# Patient Record
Sex: Female | Born: 1940
Health system: Southern US, Community
[De-identification: ages and names within clinical notes are randomized; demographics above are authoritative.]

## PROBLEM LIST (undated history)

## (undated) DIAGNOSIS — H539 Unspecified visual disturbance: Secondary | ICD-10-CM

## (undated) DIAGNOSIS — E0590 Thyrotoxicosis, unspecified without thyrotoxic crisis or storm: Secondary | ICD-10-CM

## (undated) DIAGNOSIS — G9050 Complex regional pain syndrome I, unspecified: Secondary | ICD-10-CM

## (undated) HISTORY — DX: Thyrotoxicosis, unspecified without thyrotoxic crisis or storm: E05.90

## (undated) HISTORY — PX: TONSILLECTOMY: SUR1361

## (undated) HISTORY — DX: Unspecified visual disturbance: H53.9

## (undated) HISTORY — DX: Complex regional pain syndrome I, unspecified: G90.50

---

## 1998-12-15 ENCOUNTER — Other Ambulatory Visit: Admission: RE | Admit: 1998-12-15 | Discharge: 1998-12-15 | Payer: Self-pay | Admitting: Gynecology

## 2000-01-23 ENCOUNTER — Ambulatory Visit (HOSPITAL_COMMUNITY): Admission: RE | Admit: 2000-01-23 | Discharge: 2000-01-23 | Payer: Self-pay | Admitting: Gynecology

## 2000-01-23 ENCOUNTER — Encounter: Payer: Self-pay | Admitting: Gynecology

## 2003-08-05 ENCOUNTER — Encounter: Payer: Self-pay | Admitting: Family Medicine

## 2003-08-05 ENCOUNTER — Encounter: Admission: RE | Admit: 2003-08-05 | Discharge: 2003-08-05 | Payer: Self-pay | Admitting: Family Medicine

## 2003-09-30 ENCOUNTER — Encounter: Payer: Self-pay | Admitting: Orthopedic Surgery

## 2003-09-30 ENCOUNTER — Encounter: Admission: RE | Admit: 2003-09-30 | Discharge: 2003-09-30 | Payer: Self-pay | Admitting: Orthopedic Surgery

## 2005-08-21 ENCOUNTER — Other Ambulatory Visit: Admission: RE | Admit: 2005-08-21 | Discharge: 2005-08-21 | Payer: Self-pay | Admitting: Gynecology

## 2005-10-23 ENCOUNTER — Encounter: Admission: RE | Admit: 2005-10-23 | Discharge: 2005-10-23 | Payer: Self-pay | Admitting: Family Medicine

## 2006-01-08 ENCOUNTER — Encounter: Admission: RE | Admit: 2006-01-08 | Discharge: 2006-01-08 | Payer: Self-pay | Admitting: Rheumatology

## 2006-03-28 ENCOUNTER — Encounter: Admission: RE | Admit: 2006-03-28 | Discharge: 2006-03-28 | Payer: Self-pay | Admitting: Neurology

## 2007-02-25 ENCOUNTER — Encounter: Admission: RE | Admit: 2007-02-25 | Discharge: 2007-03-18 | Payer: Self-pay | Admitting: Family Medicine

## 2007-03-19 ENCOUNTER — Encounter: Admission: RE | Admit: 2007-03-19 | Discharge: 2007-04-05 | Payer: Self-pay | Admitting: Family Medicine

## 2007-03-21 ENCOUNTER — Other Ambulatory Visit: Admission: RE | Admit: 2007-03-21 | Discharge: 2007-03-21 | Payer: Self-pay | Admitting: Gynecology

## 2008-08-25 ENCOUNTER — Encounter: Admission: RE | Admit: 2008-08-25 | Discharge: 2008-11-23 | Payer: Self-pay | Admitting: Rheumatology

## 2011-01-14 ENCOUNTER — Encounter: Payer: Self-pay | Admitting: Otolaryngology

## 2013-01-15 ENCOUNTER — Other Ambulatory Visit: Payer: Self-pay | Admitting: Otolaryngology

## 2013-01-15 DIAGNOSIS — H9120 Sudden idiopathic hearing loss, unspecified ear: Secondary | ICD-10-CM

## 2013-01-15 DIAGNOSIS — H905 Unspecified sensorineural hearing loss: Secondary | ICD-10-CM

## 2013-05-14 ENCOUNTER — Other Ambulatory Visit: Payer: Self-pay | Admitting: Obstetrics and Gynecology

## 2013-05-14 ENCOUNTER — Other Ambulatory Visit (HOSPITAL_COMMUNITY)
Admission: RE | Admit: 2013-05-14 | Discharge: 2013-05-14 | Disposition: A | Discharge status: Home / Self Care | Payer: PRIVATE HEALTH INSURANCE | Source: Ambulatory Visit | Attending: Obstetrics and Gynecology | Admitting: Obstetrics and Gynecology

## 2013-05-14 DIAGNOSIS — Z01419 Encounter for gynecological examination (general) (routine) without abnormal findings: Secondary | ICD-10-CM | POA: Insufficient documentation

## 2013-05-14 DIAGNOSIS — Z1151 Encounter for screening for human papillomavirus (HPV): Secondary | ICD-10-CM | POA: Insufficient documentation

## 2014-05-27 ENCOUNTER — Encounter: Payer: Self-pay | Admitting: Internal Medicine

## 2014-07-09 ENCOUNTER — Ambulatory Visit: Payer: Self-pay | Admitting: Internal Medicine

## 2015-01-19 ENCOUNTER — Other Ambulatory Visit: Payer: Self-pay

## 2015-01-21 ENCOUNTER — Other Ambulatory Visit: Payer: Self-pay | Admitting: Obstetrics and Gynecology

## 2015-01-21 DIAGNOSIS — Z139 Encounter for screening, unspecified: Secondary | ICD-10-CM

## 2015-01-28 ENCOUNTER — Other Ambulatory Visit: Payer: Self-pay

## 2015-01-29 ENCOUNTER — Other Ambulatory Visit: Payer: Self-pay

## 2015-04-05 ENCOUNTER — Ambulatory Visit
Admission: RE | Admit: 2015-04-05 | Discharge: 2015-04-05 | Disposition: A | Discharge status: Home / Self Care | Payer: Medicare Other | Source: Ambulatory Visit | Attending: Obstetrics and Gynecology | Admitting: Obstetrics and Gynecology

## 2015-04-05 DIAGNOSIS — Z139 Encounter for screening, unspecified: Secondary | ICD-10-CM

## 2015-05-27 ENCOUNTER — Other Ambulatory Visit (HOSPITAL_COMMUNITY)
Admission: RE | Admit: 2015-05-27 | Discharge: 2015-05-27 | Disposition: A | Discharge status: Home / Self Care | Payer: Medicare Other | Source: Ambulatory Visit | Attending: Obstetrics and Gynecology | Admitting: Obstetrics and Gynecology

## 2015-05-27 ENCOUNTER — Other Ambulatory Visit: Payer: Self-pay | Admitting: Obstetrics and Gynecology

## 2015-05-27 DIAGNOSIS — Z124 Encounter for screening for malignant neoplasm of cervix: Secondary | ICD-10-CM | POA: Diagnosis present

## 2015-05-31 LAB — CYTOLOGY - PAP

## 2017-01-29 ENCOUNTER — Encounter: Payer: Self-pay | Admitting: Psychology

## 2017-01-29 ENCOUNTER — Ambulatory Visit (INDEPENDENT_AMBULATORY_CARE_PROVIDER_SITE_OTHER): Payer: Medicare Other | Admitting: Psychology

## 2017-01-29 DIAGNOSIS — R413 Other amnesia: Secondary | ICD-10-CM | POA: Diagnosis not present

## 2017-01-29 NOTE — Progress Notes (Signed)
NEUROPSYCHOLOGICAL INTERVIEW (CPT: [id number])  Name: Tami Lewis Date of Birth: 15-Jul-1941 Date of Interview: 01/29/2017  Reason for Referral:  Tami Lewis is a 76 y.o. female who is referred for neuropsychological evaluation by Dr. Merlene Laughter of [facility] due to concerns about memory loss. This patient is accompanied in the office by her daughter who supplements the history.  History of Presenting Problem:  Tami Lewis endorsed some cognitive changes in recent years but she feels this is related to significant physical pain. She explains that she had an injury 28 years ago and developed RSD as a result. She did fall and hit her head in the context of that injury but did not lose consciousness. She reported being dazed briefly. She notes that she has had chronic pain since the injury. She has tried many different medications but reported negative side effects. She is currently treated by a naturopathic provider with "supportive nutrition to offset the pain". She takes a variety of supplements and vitamins. She also uses "bio magnets" to help with the pain and wears a wrap on her head daily for 30-60 minutes. The patient reported that the most helpful medication for her pain is Xanax which she has taken for many years. She currently takes half a milligram at least 4 times a day for a total of at least 2 mg per day. She and her daughter noted that in November 2017 her dosage was increased without her realizing it so she was taking at least twice as much. She appeared "drugged" per her daughter. Once they became aware of the mixup in the amount she was taking, she reduced back to her normal dosage without any problems.  The patient and her daughter reported that cognitive symptoms are episodic. There are times when she is "so on the ball" and then other times when she is cognitively impaired, per her daughter. They believe her cognitive symptoms are likely correlated  to pain levels. The patient's sister is having cognitive issues as well and is currently going through testing for these. Family history is also significant for Alzheimer's disease in the patient's paternal aunt.  Upon direct questioning, the patient and her daughter reported episodic forgetfulness for recent conversations/events, misplacing/losing items, forgetting appointments. She endorsed very mild word finding difficulty. She denied significant difficulty with navigation but noted that her husband drives most of the time. She has never gotten lost.  The patient also reported a history of sleep difficulties secondary to pain since her injury in 3.  Tami Lewis has not worked since her injury in 55. Workers compensation ultimately rejected her claim stating that her symptoms were "subjective", per the patient.   Current Functioning: Tami Lewis continues to manage instrumental ADLs when she is physically able. As noted previously, she does very little driving but denies any difficulty with this. She manages her medications independently and denied forgetting or missing doses. She uses a pillbox. She manages the family finances and denies any problems with this. Her daughter assists with management of appointments as the patient has a tendency to forget or get confused about appointment dates and times.  Physically, the patient reported that she currently has episodic burning pain throughout her body. She reported that she has "horrendous pelvic pain off and on".  The patient reported that her mood "varies" depending on her pain level and the weather. She does not consider herself a depressed or moody person. She denies any chronic or ongoing anxiety.  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 She has been disabled since [id number]. Marital history: Married since 1963. Three children. Seven grandchildren. Alcohol/Tobacco/Substances: Very little alcohol -- maybe twice a month she will have a small glass of wine. Never a smoker. No SA.   Medical History (per Dr. Laverle Lewis's note): Fibromyalgia Reflex Sympathetic Dystrophy Osteoporosis  Current Medications (per Dr. Laverle Lewis's note):  Alprazolam 1 mg Armour Thyroid 60 mg Vitamin D 400 unit capsule Probiotic Bone Smart Krill oil 1000 mg capsule Magnesium 300 mg Claritin 10 mg as needed Turmeric complex   Behavioral Observations:   Appearance: Neatly and appropriately dressed and groomed. Wearing a surgical mask as she is getting over a cold/flu. Gait: Ambulated independently, no gross abnormalities observed Speech: Fluent; normal rate, rhythm and volume. Talkative. Slightly atypical word choice at times / mildly paraphasic.  Thought process: Tangential Affect: Full, euthymic Interpersonal: Pleasant, appropriate   TESTING: There is medical necessity to proceed with neuropsychological assessment as the results will be used to aid in differential diagnosis and clinical decision-making and to inform specific treatment recommendations. Per the patient, her daughter and medical records reviewed, there has been a change in cognitive functioning and a reasonable suspicion of neurocognitive disorder.   PLAN: The patient will return for a full battery of neuropsychological testing with a psychometrician under my supervision. Education regarding testing procedures was provided. Subsequently, the patient will see this provider for a follow-up session at which time her test performances and my impressions and treatment recommendations will be reviewed in detail.   Full neuropsychological evaluation report to follow.

## 2017-01-30 ENCOUNTER — Telehealth: Payer: Self-pay | Admitting: Psychology

## 2017-01-30 NOTE — Progress Notes (Signed)
Returned Tami Lewis's call and spoke to her for 20 minutes. Tami Lewis had questions about the testing process and how we will be able to differentiate cause of cognitive symptoms (eg mood, pain, medication, dementia). I answered her questions to the best of my ability. She also provided her observations of the patient over the years and more recently. She reported observing a long history of variable mood and a tendency for her mother to lash out in conversation. Tami Lewis described that the family has to walk on eggshells. This is not new. Additionally, she reported that the patient has a long history of purchasing things that she does not need off of [employer] and other shopping networks. She ends up returning many of the items. This also is not new behavior and has gone on for many years. She reported that the patient gets very upset if she is questioned on anything she is doing. She reported the patient spends much of her time researching possible interventions for her pain, and if anyone questions any of these interventions, she may explode and say "You just want me to be in pain and in a wheelchair?" She reported the patient has hearing loss, and she has gone to the audiologist multiple times. She will say she wants hearing aids and then when they get there she doesn't think she needs them. She did ultimately get a pair and they seem to help when she wears them. In terms of newer behaviors/changes, Tami Lewis reported that the patient is "slowing down" and having more trouble keeping up with bookkeeping. She has paid bills twice. Tami Lewis started helping with the bills and put them on automatic-pay. The patient seemed accepting of this at the time but later complained to her husband that Tami Lewis "came in here and moved all my stuff".  Tami Lewis also notes that in addition to having the evaluation done, they want to know about available resources for family support and education.

## 2017-01-30 NOTE — Telephone Encounter (Signed)
-----   Message from Sheryn BisonBrandy L Reed sent at 01/29/2017  4:21 PM EST ----- Hi Dr. Gilberto BetterBailar  Hawa Pitkin's daughter April called [phone] and would like you to please call her.  She was seen on 01/29/17.  Thank you  Gearldine BienenstockBrandy

## 2017-02-21 ENCOUNTER — Ambulatory Visit (INDEPENDENT_AMBULATORY_CARE_PROVIDER_SITE_OTHER): Payer: Medicare Other | Admitting: Psychology

## 2017-02-21 DIAGNOSIS — R413 Other amnesia: Secondary | ICD-10-CM

## 2017-02-21 NOTE — Progress Notes (Signed)
   Neuropsychology Note  Tami SageBarbara J Lewis returned today for 3 hours of neuropsychological testing with technician, Tami Lewis, BS, under the supervision of Dr. Elvis CoilMaryBeth Bailar. The patient did not appear overtly distressed by the testing session, per behavioral observation or via self-report to the technician. Rest breaks were offered. Tami Lewis will return within 2 weeks for a feedback session with Tami Lewis at which time her test performances, clinical impressions and treatment recommendations will be reviewed in detail. The patient understands she can contact our office should she require our assistance before this time.  Full report to follow.

## 2017-03-09 ENCOUNTER — Telehealth: Payer: Self-pay | Admitting: Psychology

## 2017-03-09 NOTE — Telephone Encounter (Signed)
Dr. Alinda DoomsBailar,  April the patient's daughter would like to talk with you before the follow up appointment for her Mother.  April - [phone]

## 2017-03-13 NOTE — Telephone Encounter (Signed)
-----   Message from Sheryn BisonBrandy L Reed sent at 03/13/2017 10:23 AM EDT ----- Regarding: Not feeling well Contact: [phone] Hi Dr. Gilberto BetterBailar  [facility] and has had a full blown burning rash over her body (nerve pain). She was not sure if this is something she should see you for? She said it does get into your head after having it for so many years. She said she needs some relief? Should she see her PCP? She does have a follow up appointment coming up soon. Please Advise   Thank you Gearldine BienenstockBrandy

## 2017-03-13 NOTE — Progress Notes (Signed)
Left message for patient's daughter, returning her call from last week. Advised that I do not have results of patient's testing yet but will have them on or by 3/27 (prior to their 3/29 appointment with me). Advised that she can call me on 3/27 to discuss, but if she wanted to speak before that, she can also call anytime.

## 2017-03-13 NOTE — Progress Notes (Signed)
Called patient back. She described the rash she is experiencing, and notes that she thinks it may be a stress reaction to the neuropsych testing she did in our office recently, and getting over influenza B. She says this is common in RSD but concerning to her. She thinks the rash is subsiding but it has not completely resolved. She reported it is increasing her anxiety and she is having to take more alprazolam. I advised her to contact her PCP to advise her whether she should seek medical attention. She will be seeing me on 3/29 to follow up and review neurocognitive testing results.

## 2017-03-13 NOTE — Progress Notes (Signed)
Patient's daughter, April, returned my call and we spoke for 10 minutes. Patient is scheduled to see me on 3/29 for follow-up to review results of recent neuropsych evaluation. April wanted to know what this appointment will entail, and I described this to her. We also arranged to speak again on 3/27 via phone as I will have the results at that time; this way we can discuss prior to the patient's appointment in my office on 3/29.

## 2017-03-15 ENCOUNTER — Telehealth: Payer: Self-pay | Admitting: Psychology

## 2017-03-15 NOTE — Progress Notes (Signed)
Called patient back to let her know that our neurologists do not see CRPS/RSD so she will need to check with other neurology practices if she wants to see this specialty.

## 2017-03-15 NOTE — Telephone Encounter (Signed)
Tami OddiBarbara Ludington 12/18/1941. She would like to speak with you regarding seeing one of our neurologist here for her CRPS? She would like you to please call her. Thank you

## 2017-03-19 NOTE — Progress Notes (Addendum)
NEUROPSYCHOLOGICAL EVALUATION   Name:    Tami Lewis  Date of Birth:   [birth year] Date of Interview:  01/29/2017 Date of Testing:  02/21/2017   Date of Feedback:  03/22/2017       Background Information:  Reason for Referral:  Tami Lewis is a 76 y.o. female referred by Dr. Merlene Laughter of [facility] to assess her current level of cognitive functioning and assist in differential diagnosis. The current evaluation consisted of a review of available medical records, an interview with the patient and her daughter, and the completion of a neuropsychological testing battery. Informed consent was obtained.  History of Presenting Problem:  Tami Lewis endorsed some cognitive changes in recent years but she feels this is related to significant physical pain. She explains that she had an injury 28 years ago and developed RSD as a result. She did fall and hit her head in the context of that injury but did not lose consciousness. She reported being dazed briefly. She notes that she has had chronic pain since the injury. She has tried many different medications but reported negative side effects. She is currently treated by a naturopathic provider with "supportive nutrition to offset the pain". She takes a variety of supplements and vitamins. She also uses "bio magnets" to help with the pain and wears a wrap on her head daily for 30-60 minutes. The patient reported that the most helpful medication for her pain is Xanax which she has taken for many years. She currently takes half a milligram at least 4 times a day for a total of at least 2 mg per day. She and her daughter noted that in November 2017 her dosage was increased without her realizing it so she was taking at least twice as much. She appeared "drugged" per her daughter. Once they became aware of the mixup in the amount she was taking, she reduced back to her normal dosage without any problems.  The patient and  her daughter reported that cognitive symptoms are episodic. There are times when she is "so on the ball" and then other times when she is cognitively impaired, per her daughter. They believe her cognitive symptoms are likely correlated to pain levels. The patient's sister is having cognitive issues as well and is currently going through testing for these. Family history is also significant for Alzheimer's disease in the patient's paternal aunt.  Upon direct questioning, the patient and her daughter reported episodic forgetfulness for recent conversations/events, misplacing/losing items, forgetting appointments. She endorsed very mild word finding difficulty. She denied significant difficulty with navigation but noted that her husband drives most of the time. She has never gotten lost.  The patient also reported a history of sleep difficulties secondary to pain since her injury in 37.  Tami Lewis has not worked since her injury in 67. Workers compensation ultimately rejected her claim stating that her symptoms were "subjective", per the patient.   Current Functioning: Tami Lewis continues to manage instrumental ADLs when she is physically able. As noted previously, she does very little driving but denies any difficulty with this. She manages her medications independently and denied forgetting or missing doses. She uses a pillbox. She manages the family finances and denies any problems with this. Her daughter assists with management of appointments as the patient has a tendency to forget or get confused about appointment dates and times.  Physically, the patient reported that she currently has episodic burning pain throughout her body.  She reported that she has "horrendous pelvic pain off and on".  The patient reported that her mood "varies" depending on her pain level and the weather. She does not consider herself a depressed or moody person. She denies any chronic or ongoing  anxiety.   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 She has been disabled since 55. Marital history: Married since 1963. Three children. Seven grandchildren. Alcohol/Tobacco/Substances: Very little alcohol -- maybe twice a month she will have a small glass of wine. Never a smoker. No SA.   Medical History (per Dr. Laverle Hobby note): Fibromyalgia Reflex Sympathetic Dystrophy Osteoporosis  Current Medications (per Dr. Laverle Hobby note):  Alprazolam 1 mg Armour Thyroid 60 mg Vitamin D 400 unit capsule Probiotic Bone Smart Krill oil 1000 mg capsule Magnesium 300 mg Claritin 10 mg as needed Turmeric complex  Current Examination:  Behavioral Observations:   Appearance: Neatly and appropriately dressed and groomed, appearing younger than her chronological age. Wearing a surgical mask at her interview appointment as she was getting over a cold/flu. Gait: Ambulated independently, no gross abnormalities observed Speech: Fluent; normal rate, rhythm and volume. Talkative. Slightly atypical word choice at times / mildly paraphasic during the interview but not during the follow up session.  Thought process: Tangential Affect: Full, mildly labile, mood incongruent at times (laughing and smiling when speaking about depression and hardships) Interpersonal: Pleasant, possible reduced awareness of social cues Orientation: Oriented to all spheres. Accurately named the current President and his predecessor.  Tests Administered: . Test of Premorbid Functioning (TOPF) . Wechsler Adult Intelligence Scale-Fourth Edition (WAIS-IV): Similarities, Block Design, Matrix Reasoning, Coding and Digit Span subtests . DIRECTV Verbal Learning Test - 2nd Edition (CVLT-2)  Short Form . Repeatable Battery for the Assessment of Neuropsychological Status (RBANS) Form A:  Figure Copy and Recall subtests, Story Memory and Recall subtests and Semantic Fluency subtest . Neuropsychological Assessment Battery (NAB) Language Module, Form 1: Naming and Bill Payment Subtests . Boston Diagnostic Aphasia Examination: Complex Ideational Material subtest . Controlled Oral Word Association Test (COWAT) . Trail Making Test A and B . Clock drawing test . Alzheimer's Disease Screening Questions (AD8 Informant Report) . Personality Assessment Inventory (PAI)  Test Results: Note: Standardized scores are presented only for use by appropriately trained professionals and to allow for any future test-retest comparison. These scores should not be interpreted without consideration of all the information that is contained in the rest of the report. The most recent standardization samples from the test publisher or other sources were used whenever possible to derive standard scores; scores were corrected for age, gender, ethnicity and education when available.   Test Scores:  Test Name Raw Score Standardized Score Descriptor  TOPF 54/70 SS= 112 High average  WAIS-IV Subtests     Similarities 22/36 ss= 10 Average  Block Design 33/66 ss= 11 Average  Matrix Reasoning 13/26 ss= 11 Average  Coding 57/135 ss= 12 High average  Digit Span Forward 11/16 ss= 12 High average  Digit Span Backward 7/16 ss= 9 Average  RBANS Subtests     Figure Copy 15/20 Z= -1.6 Borderline  Figure Recall 5/20 Z= -1.8 Borderline  Story Memory 14/24 Z= -1 Low average  Story Recall 4/12 Z= -2.3 Impaired  Semantic Fluency 21/40 Z= 0.2 Average  CVLT-II Scores  Trial 1 3/9 Z= -2.5 Impaired  Trial 4 5/9 Z= -2 Impaired  Trials 1-4 total 15/36 T= 24 Severely impaired  SD Free Recall 4/9 Z= -1.5 Borderline  LD Free Recall 2/9 Z= -1.5 Borderline  LD Cued Recall 5/9 Z= -1 Low average  Recognition Discriminability 8/9  hits, 1 false positive Z= 0 Average  Forced Choice Recognition 9/9  WNL  NAB Language Subtests     Naming 30/31 T= 60 High average  Bill Payment 18/19 T= 55 Average  BDAE Complex Ideational Material 11/12  WNL  COWAT-FAS 44 T= 52 Average  COWAT-Animals 20 T= 54 Average  Trail Making Test A  47" 1 error T= 50 Average  Trail Making Test B  222" 3 errors T= 30 Impaired  Clock Drawing   WNL   AD8 6/8    PAI No elevated clinical scales to report        Description of Test Results:  Embedded performance validity indicators were within normal limits. As such, the patient's current performance on neurocognitive testing is judged to be a relatively accurate representation of her current level of neurocognitive functioning.   Premorbid verbal intellectual abilities were estimated to have been within the high average range based on a test of word reading. Psychomotor processing speed was high average. Auditory attention and working memory were high average to average. Visual-spatial construction was variable. Specifically, her manipulation of three dimensional blocks to match a model was average, while her drawn copy of a complex geometric figure was borderline impaired. Regarding the latter, she did not demonstrate gross misperception and there were no omitted features, but there was imprecision of details and possible tremulousness due to anxiety. Language abilities were intact. Specifically, confrontation naming was high average, and semantic verbal fluency was average. Auditory comprehension of complex ideational material was within normal limits. On a simulated bill payment task, requiring multiple aspects of expressive and receptive language, she performed within the average range. With regard to verbal memory, encoding and acquisition of non-contextual information (i.e., word list) was severely impaired across four learning trials. After a brief distracter task, free recall was borderline  impaired (4/9 items recalled). After a delay, free recall was borderline impaired (2/9 items recalled). Cued recall was low average (5/9 items recalled. Performance on a yes/no recognition task was average. On another verbal memory test, encoding and acquisition of contextual auditory information (i.e., short story) was low average. After a delay, free recall was impaired. With regard to non-verbal memory, delayed free recall of visual information was borderline impaired. Performance on tests measuring executive functioning were mostly intact. Mental flexibility and set-shifting were impaired on Trails B; she committed three set loss errors on the task. Verbal fluency with phonemic search restrictions was average. Verbal abstract reasoning was average. Non-verbal abstract reasoning was average. Performance on a clock drawing task was within normal limits.   The patient was also administered an extensive measure of psychopathology and personality (PAI). Her pattern of responding on this measure suggested a significant degree of positive impression management, or a tendency to portray herself as being relatively free of common shortcomings to which most individuals will admit, and she appears somewhat reluctant to recognize minor faults in herself.  Given this apparent tendency to repress undesirable characteristics, the interpretive hypotheses derived from the PAI must be interpreted with caution.  Although there is no evidence to suggest an effort to intentionally distort the profile, the results may underrepresent the extent and degree of any significant findings  in certain areas due to the client's tendency to avoid negative or unpleasant aspects of herself. Meanwhile, with respect to negative impression management, there are subtle suggestions that the client attempted to portray  herself in a negative or pathological manner in particular areas.  Some concern about distortion of the clinical picture must be  raised as a result; the client presents with certain patterns or combinations of features that are unusual or atypical in clinical populations but relatively common among individuals feigning mental disorder.   According to the patient's self-report on the PAI, she describes NO significant problems in the following areas: unusual thoughts or peculiar experiences; antisocial behavior; problems with empathy; undue suspiciousness or hostility; extreme moodiness and impulsivity; unhappiness and depression; unusually elevated mood or heightened activity; marked anxiety; problematic behaviors used to manage anxiety.  The PAI clinical profile reveals no marked elevations that should be considered to indicate the presence of clinical psychopathology.  However, as noted previously, a level of defensiveness is suspected. It should be noted that there are some areas where the client described problems of greater intensity than is typical of defensive respondents.  These areas could indicate problems that merit further attention.  These areas include: preoccupation with physical functioning; frequent routine physical complaints; drug abuse or dependence; moodiness; physical signs of anxiety; and physical signs of depression. With respect to suicidal ideation, the patient is not reporting distress from thoughts of self-harm. The patient's interest in and motivation for treatment is somewhat below average in comparison to adults who are not being seen in a therapeutic setting.  Furthermore, her level of treatment motivation is substantially lower than is typical of individuals being seen in treatment settings.     Clinical Impressions: Psychological factors affecting other medical conditions (Reflex Sympathetic Dystrophy). Anxiety disorder. Results of cognitive testing revealed more areas of intact function than areas of challenge. She did demonstrate reduced encoding of new information, but her retention of encoded  information was intact. Mental flexibility on a timed task was reduced. Otherwise, all test performances were within normal limits for age and relative to estimated premorbid intellectual abilities. Based on these test results, she does not meet diagnostic criteria for a dementia syndrome. I do not suspect an underlying neurodegenerative condition such as Alzheimer's disease at this point in time. It is my opinion that her severe anxiety (related to somatic complaints/RSD) and her chronic use of alprazolam are the biggest contributors to functional cognitive complaints and overall wellbeing. In order to improve functioning, reduce anxiety and decrease the risk of further cognitive decline, I would recommend a change in her treatment for anxiety. There are less risks associated with other classes of medication such as SSRIs. The patient has been resistant to seeing psychiatrists in the past but today she reports she is amenable to a referral to a psychiatrist I recommend.   In addition to psychopharmacological intervention, psychotherapy may be useful in increasing awareness of the relationship between emotions and somatic symptoms. Since she is interested in holistic treatments, she may be more willing to seek treatment with a holistic psychiatrist or psychologist for treatments such as biofeedback, relaxation training and mindfulness meditation.     Recommendations/Plan: 1. I will refer the patient to Dr. Archer Asa for psychiatric consultation (and hopefully ongoing treatment/management).   2. I spoke to the patient's daughter privately and advised her that she may also benefit from supportive counseling to assist her in coping with her mother's illness and behavioral/relational patterns that have developed as  a result. It does seem the patient has a history of help-seeking/help-rejecting mentality that affects her relationships with others who are trying to help her.   Feedback to Patient: Tami SageBarbara  J Lewis and her daughter, Tami Lewis, returned for a feedback appointment on 03/22/2017 to review the results of her neuropsychological evaluation with this provider. 50 minutes face-to-face time was spent reviewing her test results, my impressions and my recommendations.    Total time spent on this patient's case: 90791x1 unit for interview with psychologist; [id number] units of testing by psychometrician under psychologist's supervision; [phone] units for medical record review, scoring of neuropsychological tests, interpretation of test results, preparation of this report, and review of results to the patient by psychologist.      Thank you for your referral of Tami Lewis. Please feel free to contact me if you have any questions or concerns regarding this report.

## 2017-03-22 ENCOUNTER — Encounter: Payer: Self-pay | Admitting: Psychology

## 2017-03-22 ENCOUNTER — Ambulatory Visit (INDEPENDENT_AMBULATORY_CARE_PROVIDER_SITE_OTHER): Payer: Medicare Other | Admitting: Psychology

## 2017-03-22 DIAGNOSIS — F4542 Pain disorder with related psychological factors: Secondary | ICD-10-CM

## 2017-03-22 DIAGNOSIS — R413 Other amnesia: Secondary | ICD-10-CM | POA: Diagnosis not present

## 2017-03-22 NOTE — Patient Instructions (Signed)
Results of cognitive testing revealed more areas of intact function than areas of challenge. She did demonstrate reduced encoding of new information, but her retention of encoded information was intact. Mental flexibility on a timed task was reduced. Otherwise, all test performances were within normal limits for age and relative to estimated premorbid intellectual abilities. Based on these test results, she does not meet diagnostic criteria for a dementia syndrome. I do not suspect an underlying neurodegenerative condition such as Alzheimer's disease at this point in time. It is my opinion that her severe anxiety (related to somatic complaints/RSD) and her chronic use of alprazolam are the biggest contributors to functional cognitive complaints and overall wellbeing. In order to improve functioning, reduce anxiety and decrease the risk of further cognitive decline, I would recommend a change in her treatment for anxiety. For example, there are less risks associated with other classes of medication such as SSRIs. I will place a referral to Dr. Archer AsaGerald Plovsky, geriatric psychiatrist.

## 2017-03-27 ENCOUNTER — Telehealth: Payer: Self-pay | Admitting: Psychology

## 2017-03-27 NOTE — Telephone Encounter (Signed)
She would like you to please call her. She has some questions. Thank you

## 2017-03-29 ENCOUNTER — Telehealth: Payer: Self-pay | Admitting: Psychology

## 2017-03-29 NOTE — Telephone Encounter (Signed)
April called regarding her mother. She said she needs a copy of the report from testing and she would also like to speak with you. She has some questions regarding the referral for her mom. Thank you

## 2017-04-02 NOTE — Progress Notes (Signed)
Returned patient's daughters call. Spoke with her for 10 minutes and answered her questions to the best of my ability. Also agreed to mail copy of full report. April had several questions regarding her mother's condition and what treatments might be most optimal. I reminded her that I am not an expert in RSD/CRPS but that her mother is clearly experiencing significant anxiety which may or may not be manifesting as pain but is certainly contributing to pain and daily functioning. Reminded her that is why we are seeking psychiatry evaluation and treatment. April states that first available appointment with Dr. Donell Beers was in June or July and wonders if I know of any other geriatric psychiatrists in the area. I advised that I do not, and that I would recommend taking the June/July appointment as most psychiatrists do have significant wait times for new patients. April also inquired about therapists she might see to help her cope with her mother's condition/behaviors. I gave her contact information for [facility] and [facility].

## 2017-04-03 ENCOUNTER — Encounter: Payer: Medicare Other | Admitting: Psychology

## 2017-04-05 ENCOUNTER — Telehealth: Payer: Self-pay | Admitting: Psychology

## 2017-04-05 NOTE — Progress Notes (Signed)
Patient left a voicemail message for me yesterday. Stated that Dr. Donell Beers did not have availability to see her as a new patient until July, so she scheduled with another psychiatrist that could see her sooner. She wanted to make sure this was okay. I returned her call and left her a voicemail message indicating that this was perfectly fine.

## 2017-04-09 ENCOUNTER — Telehealth: Payer: Self-pay | Admitting: Psychology

## 2017-04-09 NOTE — Telephone Encounter (Signed)
Daughter called and would like referral to [facility] for her mother.  The other provider could not see her until July 17.  Lupita Leash is the contact person at the office - [phone].  Fax [phone] Please call the daughter when this referral has been made.

## 2017-04-10 ENCOUNTER — Telehealth: Payer: Self-pay | Admitting: Psychology

## 2017-04-10 NOTE — Progress Notes (Signed)
  Called Pt's daughter April to inform that referral and report was sent yesterday via fax to [facility] at the number provided.

## 2017-04-10 NOTE — Progress Notes (Signed)
Called Pt's daughter April to inform that referral and report was sent yesterday via fax to [facility] at the number provided.

## 2017-05-10 ENCOUNTER — Ambulatory Visit (HOSPITAL_COMMUNITY): Payer: Medicare Other | Admitting: Psychiatry

## 2017-05-17 ENCOUNTER — Ambulatory Visit (HOSPITAL_COMMUNITY): Payer: Medicare Other | Admitting: Psychiatry

## 2017-07-04 ENCOUNTER — Ambulatory Visit (HOSPITAL_COMMUNITY): Payer: Medicare Other | Admitting: Psychiatry

## 2018-08-08 ENCOUNTER — Ambulatory Visit: Payer: Medicare Other | Admitting: Neurology

## 2018-08-08 ENCOUNTER — Encounter: Payer: Self-pay | Admitting: Neurology

## 2018-08-08 ENCOUNTER — Other Ambulatory Visit: Payer: Self-pay

## 2018-08-08 VITALS — BP 120/78 | HR 79 | Resp 18 | Wt 148.5 lb

## 2018-08-08 DIAGNOSIS — R208 Other disturbances of skin sensation: Secondary | ICD-10-CM | POA: Diagnosis not present

## 2018-08-08 DIAGNOSIS — R413 Other amnesia: Secondary | ICD-10-CM | POA: Diagnosis not present

## 2018-08-08 DIAGNOSIS — M79604 Pain in right leg: Secondary | ICD-10-CM | POA: Diagnosis not present

## 2018-08-08 DIAGNOSIS — G9050 Complex regional pain syndrome I, unspecified: Secondary | ICD-10-CM

## 2018-08-08 DIAGNOSIS — M79605 Pain in left leg: Secondary | ICD-10-CM

## 2018-08-08 MED ORDER — LAMOTRIGINE 100 MG PO TABS: 100.0000 mg | ORAL_TABLET | Freq: Two times a day (BID) | ORAL | 11 refills | Status: DC

## 2018-08-08 MED ORDER — LAMOTRIGINE 25 MG PO TABS: ORAL_TABLET | ORAL | 0 refills | Status: DC

## 2018-08-08 NOTE — Patient Instructions (Signed)
The pharmacy has the prescription of lamotrigine 25 mg. Please take it as follows: For 1 week take 1 pill once a day. The second week, take 1 pill twice a day. The third week, take 1 pill in the morning and 2 at bedtime or evening. The fourth week take 2 pills twice a day.  I have also give you a paper prescription for lamotrigine 100 mg tablets after the first 4 weeks you should take 100 mg twice a day.  Most people tolerate lamotrigine very well. About 1%  will get a rash. If you do get a significant rash stop the medicine immediately and let us know.

## 2018-08-08 NOTE — Progress Notes (Addendum)
[facility]  PATIENT: Tami Lewis DOB: 10/30/1941  REFERRING DOCTOR OR PCP:  Merlene LaughterHal Stoneking SOURCE: patient, notes from Dr. Pete GlatterStoneking  _________________________________   HISTORICAL  CHIEF COMPLAINT:  Chief Complaint  Patient presents with  . Reflex Sympathetic Dystrophy    Sts. referred by PT for tx. of reflex sympathetic dystrophy. Initial injury was right sprained ankle, head injury sustained during a fall/fim    HISTORY OF PRESENT ILLNESS:  I had the [facility] patient, Tami Lewis[facility] for neurologic consultation regarding her reflex sympathetic dystrophy.   She has difficulty staying on task during the interview some details are difficult to obtain from her.   [place] by her daughter.  She is a 77 year old woman who has had pain felt to be due to RSD since a right sprained ankle and head injury after a fall in 1990.   She had continued pain afterwards in the right leg.     She initially saw a chiropractor and had some treatments without benefit.   She was placed on a 'natural gabapentin' initially and NSAIDs.   She had a second fall in 1998 and broke her right wrist.      At various times, se has been on Celebrex, gabapentin, pregabalin with only mild transient benefit.     She felt her heart racing with Neurontin.  She prefer holistic treatments.    She is on alprazolam 0.5 mg 2-3 times a day.       Initially pain was only in her foot and after the wrist injury her arm was also involved.    More recently, over the last couple years, she feels pain in her ribs and back.    This week, she was prescribed diclofenac 3%/baclofen 2%/gabapentin 5%/lidocaine 5%/menthol 1%.   It is compounded at [facility].  She has only used x 2 days and is not sure it is helping any.        She wakes up with pain and brain fog.    Her head works.    She feels weaker.    She uses magnets and feels that they calm her head down.  She has  started PT recently and they recommended that she sees neurology.   She often feels too cold or too hot and pain is wprse when cold.   Currently, left and right leg are the same temperature but she reports that they are sometimes different.     She has not had skin changes.     The daughter has not noted changes in temperature.      She had some memoy loss and was referred for neuropsychologic testing.   She saw Dr. Dimas ChyleBailar-Heath who diagnosed severe anxiety but no primary dementia.   Detailed testing, results and recommendations were reviewed.  There is a family history of Alzheimer's disease.  She has some insomnia.  She also has a uterine prolapse and wears a pessary.     While flying last month, she had a lot of pain inher ears.    She also had pain in her knees for the next 3-4 days.        REVIEW OF SYSTEMS: Constitutional: No fevers, chills, sweats, or change in appetite.  She has fatigue and memory loss. Eyes: No visual changes, double vision, eye pain Ear, nose and throat: No hearing loss, ear pain, nasal congestion, sore throat Cardiovascular: No chest pain, palpitations Respiratory: No shortness of breath at rest or with  exertion.   No wheezes GastrointestinaI: No nausea, vomiting, diarrhea, abdominal pain, fecal incontinence Genitourinary: No dysuria, urinary retention or frequency.  He has prolapse of the uterus Musculoskeletal:As above Integumentary: No rash, pruritus, skin lesions Neurological: as above Psychiatric: No depression at this time.  No anxiety Endocrine: No palpitations, diaphoresis, change in appetite, change in weigh or increased thirst Hematologic/Lymphatic: No anemia, purpura, petechiae. Allergic/Immunologic: No itchy/runny eyes, nasal congestion, recent allergic reactions, rashes  ALLERGIES: No Known Allergies  HOME MEDICATIONS:  Current Outpatient Medications:  .  ALPRAZolam (XANAX) 1 MG tablet, Take 1 mg by mouth 2 (two) times daily., Disp: , Rfl:    .  ARMOUR THYROID 60 MG tablet, Take 60 mg by mouth daily. 3/4 of a tablet daily, Disp: , Rfl: 3 .  NONFORMULARY OR COMPOUNDED ITEM, Dic3%/Bac2%/GAB5%/Lid5%/Menthol1%.   [facility] in [place], [place].  Phone# [phone], Disp: , Rfl:  .  PREMARIN vaginal cream, APPLY 1/2 GRAM VAGINALLY 2 TIMES A WEEK, Disp: , Rfl: 0 .  lamoTRIgine (LAMICTAL) 100 MG tablet, Take 1 tablet (100 mg total) by mouth 2 (two) times daily., Disp: 60 tablet, Rfl: 11 .  lamoTRIgine (LAMICTAL) 25 MG tablet, Take 1 po qd x 1week, then 1 po bid x 1 wk, then 1 po qAm and 2 po qPM x 1 wk, then 2 po bid x 1 wk, Disp: 70 tablet, Rfl: 0  PAST MEDICAL HISTORY: Past Medical History:  Diagnosis Date  . Hyperthyroidism   . Reflex sympathetic dystrophy   . Vision abnormalities     PAST SURGICAL HISTORY: Past Surgical History:  Procedure Laterality Date  . TONSILLECTOMY      FAMILY HISTORY: Family History  Problem Relation Age of Onset  . Stroke Father   . Dementia Paternal Aunt     SOCIAL HISTORY:  Social History   Socioeconomic History  . Marital status: Married    Spouse name: Not on file  . Number of children: Not on file  . Years of education: Not on file  . Highest education level: Not on file  Occupational History  . Not on file  Social Needs  . Financial resource strain: Not on file  . Food insecurity:    Worry: Not on file    Inability: Not on file  . Transportation needs:    Medical: Not on file    Non-medical: Not on file  Tobacco Use  . Smoking status: Never Smoker  . Smokeless tobacco: Never Used  Substance and Sexual Activity  . Alcohol use: No    Comment: 1-2 small glasses of wine per month  . Drug use: No  . Sexual activity: Not on file  Lifestyle  . Physical activity:    Days per week: Not on file    Minutes per session: Not on file  . Stress: Not on file  Relationships  . Social connections:    Talks on phone: Not on file    Gets together: Not on file    Attends  religious service: Not on file    Active member of club or organization: Not on file    Attends meetings of clubs or organizations: Not on file    Relationship status: Not on file  . Intimate partner violence:    Fear of current or ex partner: Not on file    Emotionally abused: Not on file    Physically abused: Not on file    Forced sexual activity: Not on file  Other Topics Concern  .  Not on file  Social History Narrative  . Not on file     PHYSICAL EXAM  Vitals:   08/08/18 1040  BP: 120/78  Pulse: 79  Resp: 18  Weight: 148 lb 8 oz (67.4 kg)    There is no height or weight on file to calculate BMI.   General: The patient is well-developed and well-nourished and in no acute distress   Neck: The neck is supple, no carotid bruits are noted.  The neck is mildly tender  Cardiovascular: The heart has a regular rate and rhythm with a normal S1 and S2. There were no murmurs, gallops or rubs.  .  Skin: Extremities are without rash or edema.  There are no classic skin changes of RSD.  Musculoskeletal: She has mild tenderness in the lumbar paraspinal region   Neurologic Exam  Mental status: The patient is alert and oriented x 3 at the time of the examination. The patient has apparent normal recent and remote memory, with an apparently normal attention span and concentration ability.   Speech is normal.  Cranial nerves: Extraocular movements are full. Pupils are equal, round, and reactive to light and accomodation.  Visual fields are full.  Facial symmetry is present. There is good facial sensation to soft touch bilaterally.Facial strength is normal.  Trapezius and sternocleidomastoid strength is normal. No dysarthria is noted.  The tongue is midline, and the patient has symmetric elevation of the soft palate. No obvious hearing deficits are noted.  Motor:  Muscle bulk is normal.   Tone is normal. Strength is  5 / 5 in all 4 extremities.   Sensory: Sensory testing is intact to  pinprick, soft touch and vibration sensation in all 4 extremities.  Coordination: Cerebellar testing reveals good finger-nose-finger and heel-to-shin bilaterally.  Gait and station: Station is normal.   Gait is arthritic.  Tandem gait is mildly wide.  Romberg is negative.   Reflexes: Deep tendon reflexes are symmetric and normal bilaterally.   Plantar responses are flexor.    DIAGNOSTIC DATA (LABS, IMAGING, TESTING) - I reviewed patient records, labs, notes, testing and imaging myself where available.      ASSESSMENT AND PLAN  Memory loss  Dysesthesia  RSD (reflex sympathetic dystrophy)  Pain in both lower extremities    In summary, Tami Lewis is a 77 year old woman who has carried a diagnosis of reflex sympathetic dystrophy for almost 30 years since she had pain that persisted after his right ankle.  She has many somatic complaints.  Currently she is reporting pain all over though it is worse on her right side.  I am not sure that she has reflex sympathetic dystrophy as she has no skin changes consistent with a diagnosis and there is no temperature asymmetry in the limbs.  However, she seems to have dysesthesias.  She also appears to have some agitation with anxiety.  I will have her start lamotrigine and try to titrate up to a dose of 100 mg twice a day as it can be very helpful for dysesthetic pain and also may help her anxiety/agitation.  We went over the side effects including rash and she should stop the medication if one appears.  She is advised to continue to try to be active.  She should continue the physical therapy.  She will return to see me in 3 months.  She or her daughter will call sooner if she has new or worsening neurologic symptoms.   Richard A. Epimenio FootSater, MD, [facility],FAAN 08/08/2018, 5:35  PM Certified in Neurology, Clinical Neurophysiology, Sleep Medicine, Pain Medicine and Neuroimaging  [facility] [address] [place], [place]  [address] [phone]

## 2018-08-30 ENCOUNTER — Other Ambulatory Visit: Payer: Self-pay | Admitting: Neurology

## 2018-10-14 ENCOUNTER — Telehealth: Payer: Self-pay | Admitting: Neurology

## 2018-10-14 NOTE — Telephone Encounter (Signed)
Pt is asking for a call from RN to discuss the management of her medications

## 2018-10-14 NOTE — Telephone Encounter (Signed)
Spoke with pt.  Dr. Epimenio Foot saw her once on 08/07/18, for hx. of RSD. Dr. Epimenio Foot rx'd Lamictal and asked to see her back in 3 mos. She has not started the Lamictal. Sts. she was rx'd a compounded cream by Dr. Pete Glatter, and had a rash with that cream, so was waiting for that to clear before starting the Lamictal. She sts. she continues to have chronic widespread pain, would like to have ketamine infusions, but understands that Dr. Epimenio Foot does not rx. these infusions, and asks if Dr. Epimenio Foot will rx. Ketamine lozenges. I have explained that Dr. Epimenio Foot does not rx. this medication, as our office is not a pain mx. facility.  She verbalized understanding of same.  She does not have a f/u appt. sched. with Dr. Epimenio Foot.  I made this for her today--12/16/18, arrival time of 10am for a 1030 appt/fim

## 2018-12-12 ENCOUNTER — Ambulatory Visit: Payer: Medicare Other | Admitting: Neurology

## 2018-12-16 ENCOUNTER — Ambulatory Visit: Payer: Self-pay | Admitting: Neurology

## 2019-07-24 ENCOUNTER — Other Ambulatory Visit: Payer: Self-pay | Admitting: Neurology

## 2020-02-09 ENCOUNTER — Ambulatory Visit: Payer: Medicare Other | Attending: Internal Medicine

## 2020-09-07 ENCOUNTER — Institutional Professional Consult (permissible substitution): Payer: Medicare Other | Admitting: Neurology

## 2020-09-22 ENCOUNTER — Ambulatory Visit: Payer: Self-pay | Admitting: Family Medicine

## 2021-03-01 DIAGNOSIS — N8111 Cystocele, midline: Secondary | ICD-10-CM | POA: Diagnosis not present

## 2021-03-01 DIAGNOSIS — Z4689 Encounter for fitting and adjustment of other specified devices: Secondary | ICD-10-CM | POA: Diagnosis not present

## 2021-03-01 DIAGNOSIS — G9050 Complex regional pain syndrome I, unspecified: Secondary | ICD-10-CM | POA: Diagnosis not present

## 2021-03-14 DIAGNOSIS — M797 Fibromyalgia: Secondary | ICD-10-CM | POA: Diagnosis not present

## 2021-03-14 DIAGNOSIS — M545 Low back pain, unspecified: Secondary | ICD-10-CM | POA: Diagnosis not present

## 2021-03-14 DIAGNOSIS — G9050 Complex regional pain syndrome I, unspecified: Secondary | ICD-10-CM | POA: Diagnosis not present

## 2021-04-15 DIAGNOSIS — G9050 Complex regional pain syndrome I, unspecified: Secondary | ICD-10-CM | POA: Diagnosis not present

## 2021-04-15 DIAGNOSIS — Z79899 Other long term (current) drug therapy: Secondary | ICD-10-CM | POA: Diagnosis not present

## 2021-04-15 DIAGNOSIS — G4459 Other complicated headache syndrome: Secondary | ICD-10-CM | POA: Diagnosis not present

## 2021-04-18 ENCOUNTER — Other Ambulatory Visit: Payer: Self-pay | Admitting: Geriatric Medicine

## 2021-04-18 DIAGNOSIS — Z79899 Other long term (current) drug therapy: Secondary | ICD-10-CM

## 2021-04-21 ENCOUNTER — Institutional Professional Consult (permissible substitution): Payer: Medicare Other | Admitting: Neurology

## 2021-05-03 ENCOUNTER — Ambulatory Visit
Admission: RE | Admit: 2021-05-03 | Discharge: 2021-05-03 | Disposition: A | Discharge status: Home / Self Care | Payer: Medicare Other | Source: Ambulatory Visit | Attending: Geriatric Medicine | Admitting: Geriatric Medicine

## 2021-05-03 DIAGNOSIS — R42 Dizziness and giddiness: Secondary | ICD-10-CM | POA: Diagnosis not present

## 2021-05-03 DIAGNOSIS — Z79899 Other long term (current) drug therapy: Secondary | ICD-10-CM

## 2021-06-10 DIAGNOSIS — Z4689 Encounter for fitting and adjustment of other specified devices: Secondary | ICD-10-CM | POA: Diagnosis not present

## 2021-06-10 DIAGNOSIS — N8111 Cystocele, midline: Secondary | ICD-10-CM | POA: Diagnosis not present

## 2021-06-23 ENCOUNTER — Other Ambulatory Visit: Payer: Self-pay | Admitting: Geriatric Medicine

## 2021-06-23 DIAGNOSIS — R52 Pain, unspecified: Secondary | ICD-10-CM

## 2021-06-23 DIAGNOSIS — N6310 Unspecified lump in the right breast, unspecified quadrant: Secondary | ICD-10-CM | POA: Diagnosis not present

## 2021-06-23 DIAGNOSIS — N6321 Unspecified lump in the left breast, upper outer quadrant: Secondary | ICD-10-CM | POA: Diagnosis not present

## 2021-06-24 ENCOUNTER — Other Ambulatory Visit: Payer: Self-pay | Admitting: Geriatric Medicine

## 2021-06-24 ENCOUNTER — Ambulatory Visit
Admission: RE | Admit: 2021-06-24 | Discharge: 2021-06-24 | Disposition: A | Discharge status: Home / Self Care | Payer: Medicare Other | Source: Ambulatory Visit | Attending: Geriatric Medicine | Admitting: Geriatric Medicine

## 2021-06-24 ENCOUNTER — Other Ambulatory Visit: Payer: Medicare Other

## 2021-06-24 ENCOUNTER — Other Ambulatory Visit: Payer: Self-pay

## 2021-06-24 DIAGNOSIS — N6310 Unspecified lump in the right breast, unspecified quadrant: Secondary | ICD-10-CM

## 2021-06-24 DIAGNOSIS — R52 Pain, unspecified: Secondary | ICD-10-CM

## 2021-06-24 DIAGNOSIS — N6311 Unspecified lump in the right breast, upper outer quadrant: Secondary | ICD-10-CM | POA: Diagnosis not present

## 2021-06-24 DIAGNOSIS — N6312 Unspecified lump in the right breast, upper inner quadrant: Secondary | ICD-10-CM | POA: Diagnosis not present

## 2021-06-24 DIAGNOSIS — N6012 Diffuse cystic mastopathy of left breast: Secondary | ICD-10-CM | POA: Diagnosis not present

## 2021-06-24 DIAGNOSIS — N644 Mastodynia: Secondary | ICD-10-CM

## 2021-07-05 ENCOUNTER — Ambulatory Visit: Payer: Medicare Other | Admitting: Neurology

## 2021-07-05 ENCOUNTER — Encounter: Payer: Self-pay | Admitting: Neurology

## 2021-07-05 ENCOUNTER — Other Ambulatory Visit: Payer: Self-pay

## 2021-07-05 VITALS — BP 107/70 | HR 80 | Ht 64.0 in | Wt 138.5 lb

## 2021-07-05 DIAGNOSIS — G9050 Complex regional pain syndrome I, unspecified: Secondary | ICD-10-CM

## 2021-07-05 DIAGNOSIS — R413 Other amnesia: Secondary | ICD-10-CM | POA: Diagnosis not present

## 2021-07-05 DIAGNOSIS — R208 Other disturbances of skin sensation: Secondary | ICD-10-CM | POA: Diagnosis not present

## 2021-07-05 DIAGNOSIS — F418 Other specified anxiety disorders: Secondary | ICD-10-CM

## 2021-07-05 MED ORDER — LAMOTRIGINE 25 MG PO TABS: ORAL_TABLET | ORAL | 5 refills | Status: DC

## 2021-07-05 NOTE — Progress Notes (Signed)
[facility]  PATIENT: Tami Lewis DOB: 1941-03-03  REFERRING DOCTOR OR PCP: Lajean Manes, MD SOURCE: Patient, daughter, notes from primary care, imaging and lab reports, imaging studies personally reviewed.  _________________________________   HISTORICAL  CHIEF COMPLAINT:  Chief Complaint  Patient presents with   Consult    Rm 2. With daughter April. Pt here for further evaluation for worsening RSD symptoms and memory loss. Pt states she has a lot of burning, pt uses CBD w/ lidocaine for relief. Back pn has worsened since 2019. Rash in groin area.     HISTORY OF PRESENT ILLNESS:  I had the pleasure of seeing Tami Lewis at [facility] for neurologic consultation regarding her memory loss and RSD.  She is an 80 year old woman who has had pain since 1990 and memory issues for the past several years.  Her ankle pain started in 1990 after a right ankle injury/fracture after she slipped on an icy sidewalk.   Although the anle is always the worse, she now experiences pain elsewhere.   She now reports constant burinng all over her body ad a rash all over.   She uses CBD and/or lidocaine creams and ointments.   She had been on gabapentin and duloxetine in the past.     She has anxiety and has been on Xanax x many years.    She feels it does not help her as much now.   She is more moody and irritable and gets angry easily  I saw her in 2019 for memory loss and or RSD related pain in the right ankle.  She also was having some agitation and anxiety.  I prescribed lamotrigine to titrate up to 100 mg p.o. twice daily.  She never started the medication..  More recently, in May, she was prescribed gabapentin 100 mg p.o. 3 times daily but feels it causes constipation (difficult for her as she has a prolapsed uterus).  In 2019, she had neuro cognitive testing.  She saw Dr. Bonita Quin who diagnosed severe anxiety but no primary dementia.   Detailed testing,  results and recommendations were reviewed.  There is a family history of Alzheimer's disease.  She has some insomnia.    In 2020, she was placed on Paxil and her mood they have done little bit better but she stopped because she felt her tinnitus was worse.  Her daughter reports that she had more issues with her memory starting in 2020.  She became more forgetful.  She would blame others for her mistakes.  She would have episodes of irritability and anger.  She had an evaluation at the [facility] but walked out angry during the exam.  Symptoms progressed a little bit further in 2021.  In March 2022, she was placed on Zoloft but stopped after 2 weeks.  I personally reviewed the CT scan of the head dated 05/03/2021.  This showed no acute findings.  There was mild atrophy, typical for age.  There was chronic hypodense changes in the right anterior limb of the internal capsule, not present in 2007.   MRI of the brain 03/28/2006 showed minimal chronic microvascular ischemic change.  CBC and CMP blood work was normal earlier this year.  REVIEW OF SYSTEMS: Constitutional: No fevers, chills, sweats, or change in appetite Eyes: No visual changes, double vision, eye pain Ear, nose and throat: No hearing loss, ear pain, nasal congestion, sore throat Cardiovascular: No chest pain, palpitations Respiratory:  No shortness of breath at  rest or with exertion.   No wheezes GastrointestinaI: No nausea, vomiting, diarrhea, abdominal pain, fecal incontinence Genitourinary:  No dysuria, urinary retention or frequency.  No nocturia. Musculoskeletal:  No neck pain, back pain Integumentary: No rash, pruritus, skin lesions Neurological: as above Psychiatric: As above endocrine: No palpitations, diaphoresis, change in appetite, change in weigh or increased thirst Hematologic/Lymphatic:  No anemia, purpura, petechiae. Allergic/Immunologic: No itchy/runny eyes, nasal congestion, recent allergic reactions,  rashes  ALLERGIES: No Known Allergies  HOME MEDICATIONS:  Current Outpatient Medications:    lamoTRIgine (LAMICTAL) 25 MG tablet, Take one po qd x 5 days, then one po bid x 5 days then one po qAm and two po qHS, Disp: 90 tablet, Rfl: 5   ALPRAZolam (XANAX) 1 MG tablet, Take 1 mg by mouth 2 (two) times daily., Disp: , Rfl:    ARMOUR THYROID 60 MG tablet, Take 60 mg by mouth daily. 3/4 of a tablet daily, Disp: , Rfl: 3   gabapentin (NEURONTIN) 100 MG capsule, Take 100 mg by mouth 3 (three) times daily., Disp: , Rfl:    NONFORMULARY OR COMPOUNDED ITEM, Dic3%/Bac2%/GAB5%/Lid5%/Menthol1%.   [facility] in [place], [place].  Phone# [phone], Disp: , Rfl:    PREMARIN vaginal cream, APPLY 1/2 GRAM VAGINALLY 2 TIMES A WEEK, Disp: , Rfl: 0  PAST MEDICAL HISTORY: Past Medical History:  Diagnosis Date   Hyperthyroidism    Reflex sympathetic dystrophy    Vision abnormalities     PAST SURGICAL HISTORY: Past Surgical History:  Procedure Laterality Date   TONSILLECTOMY      FAMILY HISTORY: Family History  Problem Relation Age of Onset   Stroke Father    Dementia Paternal Aunt     SOCIAL HISTORY:  Social History   Socioeconomic History   Marital status: Married    Spouse name: Not on file   Number of children: Not on file   Years of education: Not on file   Highest education level: Not on file  Occupational History   Not on file  Tobacco Use   Smoking status: Never   Smokeless tobacco: Never  Substance and Sexual Activity   Alcohol use: No    Comment: 1-2 small glasses of wine per month   Drug use: No   Sexual activity: Not on file  Other Topics Concern   Not on file  Social History Narrative   Lives with husband   Right handed   Caffeine: 1 cup in the AM   Social Determinants of Health   Financial Resource Strain: Not on file  Food Insecurity: Not on file  Transportation Needs: Not on file  Physical Activity: Not on file  Stress: Not on file  Social  Connections: Not on file  Intimate Partner Violence: Not on file     PHYSICAL EXAM  Vitals:   07/05/21 1427  BP: 107/70  Pulse: 80  Weight: 138 lb 8 oz (62.8 kg)  Height: 5' 4" (1.626 m)    Body mass index is 23.77 kg/m.   General: The patient is well-developed and well-nourished and in no acute distress  HEENT:  Head is [place]/AT.  Sclera are anicteric.  Funduscopic exam shows normal optic discs and retinal vessels.  Neck: No carotid bruits are noted.  The neck is nontender.  Cardiovascular: The heart has a regular rate and rhythm with a normal S1 and S2. There were no murmurs, gallops or rubs.    Skin: Extremities are without rash or  edema.  I saw no  difference between the left and right ankle region  Musculoskeletal:  Back is nontender  Neurologic Exam  Mental status: The patient is alert and oriented x 3 at the time of the examination.  Recall was 3/3.  She has some distractibility but answers questions appropriately.Marland Kitchen   Speech is normal.  Cranial nerves: Extraocular movements are full. Pupils are equal, round, and reactive to light and accomodation.  Visual fields are full.  Facial symmetry is present. There is good facial sensation to soft touch bilaterally.Facial strength is normal.  Trapezius and sternocleidomastoid strength is normal. No dysarthria is noted.  The tongue is midline, and the patient has symmetric elevation of the soft palate. No obvious hearing deficits are noted.  Motor:  Muscle bulk is normal.   Tone is normal. Strength is  5 / 5 in all 4 extremities.   Sensory: Sensory testing is intact to pinprick, soft touch and vibration sensation in all 4 extremities.  Coordination: Cerebellar testing reveals good finger-nose-finger and heel-to-shin bilaterally.  Gait and station: Station is normal.   Gait is normal. Tandem gait is mildly wide but normal for age. Romberg is negative.   Reflexes: Deep tendon reflexes are symmetric and normal bilaterally.    Plantar responses are flexor.     ASSESSMENT AND PLAN  RSD (reflex sympathetic dystrophy) - Plan: C-reactive protein, Sedimentation rate, Sjogren's syndrome antibods(ssa + ssb)  Dysesthesia - Plan: Multiple Myeloma Panel (SPEP&IFE w/QIG), C-reactive protein, Sedimentation rate, Sjogren's syndrome antibods(ssa + ssb)  Memory loss - Plan: Multiple Myeloma Panel (SPEP&IFE w/QIG), C-reactive protein, Sedimentation rate, Sjogren's syndrome antibods(ssa + ssb), TSH, Vitamin B12  Depression with anxiety   In summary, Ms. Romack is an 80 year old woman who has a long history of right ankle pain that has been ascribed to complex regional pain syndrome.  However, the ankles look symmetric and she had normal sensation and normal temperature of the limbs.  She has reported more widespread pain more recently.  She has memory loss and irritability.  She appears to have depression with anxiety.  I discussed with her that often issues like pain and mood disturbances can affect memory by reducing focus/attention.  I cannot rule out that there is an early primary dementia but she is not showing signs of a more advanced 1 and she did not have atrophy out of proportion to age on the imaging study.  Due to her dysesthetic pain we will check ESR, CRP, SSA/SSB and SPEP/IFE.  Additionally we will check TSH and B12 because of the memory disturbance.  In an attempt to help both the dysesthetic pain as well as the mood disturbance I will have her start lamotrigine and titrate up to 25 mg in the morning and 50 mg at night.  We can continue to do this titration further if tolerated up to 100 mg twice daily.  She will return to see [place] in 3 months or sooner for new or worsening neurologic symptoms.  60-minute office visit with the majority of the time spent face-to-face for history and physical, discussion/counseling and decision-making.  Additional time with record review and documentation.    Lido Maske A. Felecia Shelling, MD,  [employer] 4/40/1027, 2:53 PM Certified in Neurology, Clinical Neurophysiology, Sleep Medicine and Neuroimaging  [facility] [address], [place], [place] [address] [phone]

## 2021-07-11 LAB — SEDIMENTATION RATE: Sed Rate: 12 mm/hr (ref 0–40)

## 2021-07-11 LAB — MULTIPLE MYELOMA PANEL, SERUM
Albumin SerPl Elph-Mcnc: 3.9 g/dL (ref 2.9–4.4)
Albumin/Glob SerPl: 1.6 (ref 0.7–1.7)
Alpha 1: 0.3 g/dL (ref 0.0–0.4)
Alpha2 Glob SerPl Elph-Mcnc: 0.8 g/dL (ref 0.4–1.0)
B-Globulin SerPl Elph-Mcnc: 1 g/dL (ref 0.7–1.3)
Gamma Glob SerPl Elph-Mcnc: 0.6 g/dL (ref 0.4–1.8)
Globulin, Total: 2.6 g/dL (ref 2.2–3.9)
IgA/Immunoglobulin A, Serum: 102 mg/dL (ref 64–422)
IgG (Immunoglobin G), Serum: 667 mg/dL (ref 586–1602)
IgM (Immunoglobulin M), Srm: 42 mg/dL (ref 26–217)
Total Protein: 6.5 g/dL (ref 6.0–8.5)

## 2021-07-11 LAB — C-REACTIVE PROTEIN: CRP: 14 mg/L — ABNORMAL HIGH (ref 0–10)

## 2021-07-11 LAB — TSH: TSH: 2.37 u[IU]/mL (ref 0.450–4.500)

## 2021-07-11 LAB — VITAMIN B12: Vitamin B-12: >2000 pg/mL — ABNORMAL HIGH (ref 232–1245)

## 2021-07-11 LAB — SJOGREN'S SYNDROME ANTIBODS(SSA + SSB)
ENA SSA (RO) Ab: <0.2 AI (ref 0.0–0.9)
ENA SSB (LA) Ab: <0.2 AI (ref 0.0–0.9)

## 2021-07-12 ENCOUNTER — Telehealth: Payer: Self-pay | Admitting: Neurology

## 2021-07-12 ENCOUNTER — Telehealth: Payer: Self-pay

## 2021-07-12 NOTE — Progress Notes (Signed)
See telephone note.

## 2021-07-12 NOTE — Telephone Encounter (Signed)
Pt called back, relayed Dr. Epimenio Foot message. Answered her question, pt verbalized understanding and had no further questions at this time.

## 2021-07-12 NOTE — Progress Notes (Signed)
Called and spoke to husband (on [place]), explained reason for call. He insisted I speak to the patient, he will have her call us when she wakes up.

## 2021-07-12 NOTE — Telephone Encounter (Signed)
Pt has some questions on how she should be taking her ALPRAZolam (XANAX) 1 MG tablet and the lamoTRIgine (LAMICTAL) 25 MG tablet. Pt is requesting a call back.

## 2021-07-12 NOTE — Telephone Encounter (Signed)
Called and spoke to husband (on DPR), explained reason for call. He insisted I speak to the patient, he will have her call us when she wakes up.  

## 2021-07-12 NOTE — Telephone Encounter (Signed)
-----   Message from Asa Lente, MD sent at 07/11/2021  6:17 PM EDT ----- Please let the patient know that the lab work is normal for age

## 2021-07-12 NOTE — Telephone Encounter (Signed)
Called the patient back she just wanted to confirm that it was ok to take the medication together. Advised that is fine.Pt verbalized understanding.

## 2021-08-08 ENCOUNTER — Other Ambulatory Visit: Payer: Self-pay | Admitting: Neurology

## 2021-08-08 ENCOUNTER — Telehealth: Payer: Self-pay | Admitting: Neurology

## 2021-08-08 MED ORDER — LAMOTRIGINE 25 MG PO TABS: ORAL_TABLET | ORAL | 0 refills | Status: DC

## 2021-08-08 NOTE — Telephone Encounter (Signed)
Called the patient's daughter back. Advised that it Looks like on 07/05/2021 the medication was written as a titration to get the patient to a goal of 100 mg twice a day. The current script had her increasing to 1 tab in am and 2 tab at bedtime which is where she is currently.  Advised since she is tolerating well then he will continue slowly increasing the medication to get her to a goal of 100 mg twice a day.   She should move forward with taking 2 tablet's in the morning and 2 tablets at bedtime (50 mg twice a day) for 5 days.  Then increase to two tablets in am and three tablets at bedtime (50 mg in am and 75 mg in pm) for 5 days then increase to 3 tablets both morning and evening for 5 days (75 mg twice a day) Then increase to 3 tablet in am and 4 tablet at bedtime for 5 days once that is completed if she tolerates well then we could send in a new script for 100 mg tablet twice a day.  Reviewed this information with the daughter and she verbalized understanding

## 2021-08-08 NOTE — Telephone Encounter (Signed)
Pt's daughter would like a call to discuss pt's current dose of lamoTRIgine (LAMICTAL) 25 MG tablet

## 2021-08-17 ENCOUNTER — Telehealth: Payer: Self-pay | Admitting: Neurology

## 2021-08-17 NOTE — Telephone Encounter (Signed)
Attempted to call the [phone] phone number 2 times and it states not available to take call and was unable to LVM. Then called the daughter [phone] and it went to VM but per DPR could not leave message. I will try reaching the patient again.

## 2021-08-17 NOTE — Telephone Encounter (Signed)
Called the patient and was able to talk with both her and her daughter and advised the patient that since she is unable to to tolerate the 2 in the am and 3 at bedtime having the dizziness, Dr Epimenio Foot is ok with the patient going back down to 50 mg in the am and 50 mg in the evening. Advised that she can stay at that dose until sept 7,2022. She should then call us and let us know how she is doing. If she feel that she is ready to move up to the next level then we can reassess at that time. Pt verbalized understanding. I advised I would also call the pharmacy making them aware of the plan since they package her medication on a weekly bases.   LVM on the pharmacy phone informing of instructions.

## 2021-08-17 NOTE — Telephone Encounter (Signed)
Pt called stating she is having some side affects to the lamoTRIgine (LAMICTAL) 25 MG tablet. The morning dosage makes her dizzy. The new dosage ask for 3 a day but pt can only tolerate 2. Pt requesting a call back.

## 2021-08-22 NOTE — Telephone Encounter (Signed)
Called the patient back and she is adamant that the sensation has returned and she is now ready to increase to 2 tab in am and 3 tablet in evening. I have advised her to start this increase on 08/23/2021 to allow time for me to reach pharmacy so they can package her medication.  I have called the pharmacy and provided a verbal order stating the starting 08/23/2021 pt will increase to 2 tablet in the morning and 3 tablets at bedtime. She will continue that dose for 14 days and we will reassess after 14 days if she is ready to increase to next titration.   I have also called the daughter and updated her on the plan as well given the pt has memory concerns.

## 2021-08-22 NOTE — Telephone Encounter (Signed)
Pt called staying she is having breakthrough pain with the lamoTRIgine (LAMICTAL) 25 MG tablet. Pt requesting a call back.

## 2021-08-25 ENCOUNTER — Telehealth: Payer: Self-pay | Admitting: Neurology

## 2021-08-25 NOTE — Telephone Encounter (Signed)
Called pt back. Explained that we just made adjustment to lamotrigine. She should give it more time to allow medication to work. Pt argumentative, wanted dose increased. I placed her on hold and spoke w/ MD. He wants to bring her in for sooner appt to discuss med management. Scheduled appt for 08/30/21 at 9am w/ MD.  I called daughter, April. Explained above. She states husband picked up new prescription and started her on increased dose. They will keep appt for 08/30/21 and discuss how things are going. Daughter states pt does not remember speaking w/ Baird Lyons. Got upset w/ pharmacy, thinking they message up her prescription.

## 2021-08-25 NOTE — Telephone Encounter (Signed)
Pt is asking for a call to discuss a possible increase on her lamoTRIgine (LAMICTAL) 25 MG tablet, she is burning from head to toe, please call.

## 2021-08-28 ENCOUNTER — Telehealth: Payer: Self-pay | Admitting: Neurology
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
## 2021-08-30 ENCOUNTER — Ambulatory Visit: Payer: Medicare Other | Admitting: Neurology

## 2021-08-30 ENCOUNTER — Encounter: Payer: Self-pay | Admitting: Neurology

## 2021-08-30 VITALS — BP 123/79 | HR 89 | Ht 64.0 in | Wt 137.0 lb

## 2021-08-30 DIAGNOSIS — R208 Other disturbances of skin sensation: Secondary | ICD-10-CM

## 2021-08-30 DIAGNOSIS — F418 Other specified anxiety disorders: Secondary | ICD-10-CM | POA: Diagnosis not present

## 2021-08-30 DIAGNOSIS — R413 Other amnesia: Secondary | ICD-10-CM | POA: Diagnosis not present

## 2021-08-30 DIAGNOSIS — G9050 Complex regional pain syndrome I, unspecified: Secondary | ICD-10-CM | POA: Diagnosis not present

## 2021-08-30 MED ORDER — DONEPEZIL HCL 5 MG PO TABS: 5.0000 mg | ORAL_TABLET | Freq: Every day | ORAL | 11 refills | Status: DC

## 2021-08-30 MED ORDER — LAMOTRIGINE 25 MG PO TABS: ORAL_TABLET | ORAL | 11 refills | Status: DC

## 2021-08-30 NOTE — Progress Notes (Signed)
[facility]  PATIENT: Tami Lewis DOB: 07/24/1941  REFERRING DOCTOR OR PCP: Tami Laughter, MD SOURCE: Patient, daughter, notes from primary care, imaging and lab reports, imaging studies personally reviewed.  _________________________________   HISTORICAL  CHIEF COMPLAINT:  Chief Complaint  Patient presents with   Follow-up    Pt with 2 daughters, rm 1. Was here to discuss medications. Throughout the weaning up on the Lamictal process she was confused on dosages. Initially with the increase she had dizziness. She was at 2 in the morning and then 3 at night. She decided the medication was causing constipation and she has stopped. (She did have a psychotic episodes)    HISTORY OF PRESENT ILLNESS:  Tami Lewis is a 80 year old woman with painful dysesthesias/RSD, memory loss and behavioral changes.  Lamotrigine helped her nerve pain but had constipation.   Over the weekend, she was in more abdominal pain and then 2 days ago had psychiatric issues with anger and confusion upon discontinuing the lamotrigine.    Miralax helped the constipation.     Of note, she had been titrated to lamotrigine 50 mg twice a day and there was increased to 15 morning and 75 mg at night when the constipation worsened.  She did have some benefit on the 50 mg twice daily dosing and may have had more benefit for the dysesthesias on the higher dose.  Over the last few years she has had difficulties with memory.  She has a lot of anxiety and cognitive testing in 2019 was more consistent with the anxiety playing a larger role in her memory loss.  However, symptoms have progressed.  She is very irritable at times and augmentative.   MMSE - Mini Mental State Exam 08/30/2021  Orientation to time 4  Orientation to Place 5  Registration 3  Attention/ Calculation 1  Recall 1  Language- name 2 objects 2  Language- repeat 1  Language- follow 3 step command 3  Language- read & follow direction  1  Write a sentence 1  Copy design 0  Total score 22      ADDITIONAL HISTORY: I saw her in 2019 for memory loss and or RSD related pain in the right ankle.  She also was having some agitation and anxiety.  I prescribed lamotrigine to titrate up to 100 mg p.o. twice daily.  She never started the medication..  More recently, in May, she was prescribed gabapentin 100 mg p.o. 3 times daily but feels it causes constipation (difficult for her as she has a prolapsed uterus).  In 2019, she had neuro cognitive testing.  She saw Dr. Dimas Lewis who diagnosed severe anxiety but no primary dementia.   Detailed testing, results and recommendations were reviewed.  There is a family history of Alzheimer's disease.  She has some insomnia.    In 2020, she was placed on Paxil and her mood they have done little bit better but she stopped because she felt her tinnitus was worse.  Her daughter reports that she had more issues with her memory starting in 2020.  She became more forgetful.  She would blame others for her mistakes.  She would have episodes of irritability and anger.  She had an evaluation at the [facility] ger[facility] center but walked out angry during the exam.  Symptoms progressed a little bit further in 2021.  In March 2022, she was placed on Zoloft but stopped after 2 weeks.  IMAGING: CT scan of the head dated 05/03/2021.  This showed no acute findings.  There was mild atrophy, typical for age.  There was chronic hypodense changes in the right anterior limb of the internal capsule, not present in 2007.   MRI of the brain 03/28/2006 showed minimal chronic microvascular ischemic change.    REVIEW OF SYSTEMS: Constitutional: No fevers, chills, sweats, or change in appetite Eyes: No visual changes, double vision, eye pain Ear, nose and throat: No hearing loss, ear pain, nasal congestion, sore throat Cardiovascular: No chest pain, palpitations Respiratory:  No shortness of breath at rest or with exertion.    No wheezes GastrointestinaI: No nausea, vomiting, diarrhea, abdominal pain, fecal incontinence Genitourinary:  No dysuria, urinary retention or frequency.  No nocturia. Musculoskeletal:  No neck pain, back pain Integumentary: No rash, pruritus, skin lesions Neurological: as above Psychiatric: As above endocrine: No palpitations, diaphoresis, change in appetite, change in weigh or increased thirst Hematologic/Lymphatic:  No anemia, purpura, petechiae. Allergic/Immunologic: No itchy/runny eyes, nasal congestion, recent allergic reactions, rashes  ALLERGIES: No Known Allergies  HOME MEDICATIONS:  Current Outpatient Medications:    ALPRAZolam (XANAX) 1 MG tablet, Take 1 mg by mouth 2 (two) times daily., Disp: , Rfl:    ARMOUR THYROID 60 MG tablet, Take 60 mg by mouth daily. 3/4 of a tablet daily, Disp: , Rfl: 3   donepezil (ARICEPT) 5 MG tablet, Take 1 tablet (5 mg total) by mouth at bedtime., Disp: 30 tablet, Rfl: 11   gabapentin (NEURONTIN) 100 MG capsule, Take 100 mg by mouth 3 (three) times daily., Disp: , Rfl:    NONFORMULARY OR COMPOUNDED ITEM, Dic3%/Bac2%/GAB5%/Lid5%/Menthol1%.   [facility] [place], [place].  Phone# [phone], Disp: , Rfl:    PREMARIN vaginal cream, APPLY 1/2 GRAM VAGINALLY 2 TIMES A WEEK, Disp: , Rfl: 0   lamoTRIgine (LAMICTAL) 25 MG tablet, Take 1 tablet twice a day x 5 days  then take 2 tablets in the morning and 2 tablets at bedtime (50 mg twice a day), Disp: 120 tablet, Rfl: 11  PAST MEDICAL HISTORY: Past Medical History:  Diagnosis Date   Hyperthyroidism    Reflex sympathetic dystrophy    Vision abnormalities     PAST SURGICAL HISTORY: Past Surgical History:  Procedure Laterality Date   TONSILLECTOMY      FAMILY HISTORY: Family History  Problem Relation Age of Onset   Stroke Father    Dementia Paternal Aunt     SOCIAL HISTORY:  Social History   Socioeconomic History   Marital status: Married    Spouse name: Not on file   Number  of children: Not on file   Years of education: Not on file   Highest education level: Not on file  Occupational History   Not on file  Tobacco Use   Smoking status: Never   Smokeless tobacco: Never  Substance and Sexual Activity   Alcohol use: No    Comment: 1-2 small glasses of wine per month   Drug use: No   Sexual activity: Not on file  Other Topics Concern   Not on file  Social History Narrative   Lives with husband   Right handed   Caffeine: 1 cup in the AM   Social Determinants of Health   Financial Resource Strain: Not on file  Food Insecurity: Not on file  Transportation Needs: Not on file  Physical Activity: Not on file  Stress: Not on file  Social Connections: Not on file  Intimate Partner Violence: Not on file     PHYSICAL  EXAM  Vitals:   08/30/21 0919  BP: 123/79  Pulse: 89  Weight: 137 lb (62.1 kg)  Height: 5\' 4"  (1.626 m)    Body mass index is 23.52 kg/m.   General: The patient is well-developed and well-nourished and in no acute distress  HEENT:  Head is [place]/AT.  Sclera are anicteric.  Skin: Extremities are without rash or  edema.  I saw no difference between the left and right ankle region  Neurologic Exam  Mental status: The patient is alert and oriented x 3 at the time of the examination.  Recall was 1/3.     She scored only 1/5 on serial sevens.  Speech is normal.  She scored 22/30 on the Mini-Mental status exam.  Cranial nerves: Extraocular movements are full.   Facial symmetry is present.  .Facial strength is normal.  No obvious hearing deficits are noted.  Motor:  Muscle bulk is normal.   Tone is normal. Strength is  5 / 5 in all 4 extremities.   Sensory: Sensory testing is intact to pinprick, soft touch and vibration sensation in all 4 extremities.  Coordination: Cerebellar testing reveals good finger-nose-finger and heel-to-shin bilaterally.  Gait and station: Station is normal.   Gait is normal. Tandem gait is mildly wide but  normal for age. Romberg is negative.   Reflexes: Deep tendon reflexes are symmetric and normal bilaterally.        ASSESSMENT AND PLAN  Memory loss  Dysesthesia  RSD (reflex sympathetic dystrophy)  Depression with anxiety   Medical treatment for the dysesthesias is difficult due to her reports of side effects.  She appears to have done better on lamotrigine 50 mg twice a day than before the lamotrigine was added.  Therefore, we will reinstate that.  I will increase the dose further because she had the constipation.  We could always reconsider this at the next visit. She scored 22/30 on the Mini-Mental Status test putting her in the mild dementia range.  Brain volume was normal for age on CT scan earlier this year.  It is certainly possible she has a mild Alzheimer's disease.  Behavioral issues are more likely related to her longstanding mood disorder though if they worsen frontotemporal dementia need Soso be considered. Donepezil for memory.  It may help her constipation also. Follow-up in November (already has appointment).  42-minute office visit with the majority of the time spent face-to-face for history and physical, discussion/counseling and decision-making.  Additional time with record review and documentation.    Tiasha Helvie A. December, MD, [facility] 08/30/2021, 10:44 AM Certified in Neurology, Clinical Neurophysiology, Sleep Medicine and Neuroimaging  [facility] [address] [place], [place] [phone]

## 2021-09-16 DIAGNOSIS — Z0000 Encounter for general adult medical examination without abnormal findings: Secondary | ICD-10-CM | POA: Diagnosis not present

## 2021-09-16 DIAGNOSIS — J300 Vasomotor rhinitis: Secondary | ICD-10-CM | POA: Diagnosis not present

## 2021-09-16 DIAGNOSIS — Z79899 Other long term (current) drug therapy: Secondary | ICD-10-CM | POA: Diagnosis not present

## 2021-09-16 DIAGNOSIS — Z1389 Encounter for screening for other disorder: Secondary | ICD-10-CM | POA: Diagnosis not present

## 2021-09-16 DIAGNOSIS — G4700 Insomnia, unspecified: Secondary | ICD-10-CM | POA: Diagnosis not present

## 2021-09-16 DIAGNOSIS — G9050 Complex regional pain syndrome I, unspecified: Secondary | ICD-10-CM | POA: Diagnosis not present

## 2021-09-16 DIAGNOSIS — M797 Fibromyalgia: Secondary | ICD-10-CM | POA: Diagnosis not present

## 2021-09-16 DIAGNOSIS — M810 Age-related osteoporosis without current pathological fracture: Secondary | ICD-10-CM | POA: Diagnosis not present

## 2021-09-16 DIAGNOSIS — E039 Hypothyroidism, unspecified: Secondary | ICD-10-CM | POA: Diagnosis not present

## 2021-10-04 ENCOUNTER — Telehealth: Payer: Self-pay | Admitting: Neurology
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
## 2021-10-04 NOTE — Telephone Encounter (Signed)
Pt's daughter April, on DPR called needing to speak to the RN or Provider regarding the pt's memory loss or possible Dementia. They have been having problems with her recently and she is needing to discuss. Please advise.

## 2021-10-05 NOTE — Telephone Encounter (Signed)
Called and spoke w/ daughter. Offered 10/12/21 at 930am w/ Dr. Epimenio Foot. She states mother does not do well w/ mornings.  Would like to hold off and talk to mother first to see if she wants this appt. She will call back if she does. Aware it may not be available if she calls back later. Otherwise, would like [place] to watch for any cx next week later in the day.

## 2021-10-06 DIAGNOSIS — N8111 Cystocele, midline: Secondary | ICD-10-CM | POA: Diagnosis not present

## 2021-10-06 DIAGNOSIS — Z4689 Encounter for fitting and adjustment of other specified devices: Secondary | ICD-10-CM | POA: Diagnosis not present

## 2021-10-10 ENCOUNTER — Telehealth: Payer: Self-pay | Admitting: Neurology

## 2021-10-10 MED ORDER — LAMOTRIGINE 25 MG PO TABS: ORAL_TABLET | ORAL | 11 refills | Status: DC

## 2021-10-10 MED ORDER — DONEPEZIL HCL 5 MG PO TABS: 5.0000 mg | ORAL_TABLET | Freq: Every day | ORAL | 11 refills | Status: DC

## 2021-10-10 NOTE — Telephone Encounter (Signed)
Pt's daughter is asking if all medications from Dr Epimenio Foot be filled in blister packs.

## 2021-10-10 NOTE — Telephone Encounter (Signed)
Called and spoke w/ daughter, April. Since this past Friday, her mother has been more upset about med dispenser. Threatening to throw it away. Telling her she can manage her own meds. Per daughter, mother tears open blister packs from [facility] pharmacy and will combine pills/not take them correctly. Has left over pills/throws them away. [facility] demanding [facility] be [facility] from [facility] claiming they are controlling her meds. [facility]. She called asking them not to. She had to end phone call w/ mother d/t mother being so upset over issue. States mother very moody.  Daughter would like to make sure [facility] dispenses meds in blister packs. I e-scribed rx's w/ note asking for this. Aware no appt available at the moment w/ MD but will be on look out for cx. She is wanting to know what resources we can provide for support. Aware I will speak w/ MD and back w/ any recommendations.

## 2021-10-11 NOTE — Telephone Encounter (Signed)
Called daughter, offered earlier appt 10/13/21 at 11:30am. She states her and mother not on speaking terms as of yesterday. She did receive text this am from her mother w/ apology. She requested I call her to offer appt. I called pt and offered. She declined, stating it was too early. She has cleaning scheduled tomorrow at home which makes her tired, fatigue, weather changes, staying up late to complete things do not allow her to get up early, get to office by 11/115am for check in for 1130am appt. She is requesting appt 12p or after. Aware we will call if anything sooner in that time frame becomes available. I called daughter back and provided update.  She is aware we would like to get her in to discuss medication management.

## 2021-10-17 NOTE — Telephone Encounter (Signed)
Called and returned April's call; she is calling to get an update and see if either the pharmacy or Pt have reached out to change pharmacy. I informed her neither have reached out to [place] to make changes. Pt's daughter unsure how to handle this and is asking we keep her in the loop if she tries to change pharmacy and how they are dispensed. Pts daughter had no further concerns and appreciated the call back.

## 2021-10-17 NOTE — Telephone Encounter (Signed)
Pt's daughter, April [place] (on [place]) called. Would like a call from the nurse to discuss mother's medication and her attempting to change the pharmacy.

## 2021-10-19 ENCOUNTER — Telehealth: Payer: Self-pay | Admitting: Neurology

## 2021-10-19 ENCOUNTER — Other Ambulatory Visit: Payer: Self-pay | Admitting: Neurology

## 2021-10-19 NOTE — Telephone Encounter (Signed)
As previously mentioned the patient gets her medication packaged by the pharmacy. We have not made any changes to the medication. The patient should talk to the pharmacy. If the pharmacy needs any additional information from [place] then they will contact us.  If the patient calls back please redirect her to discuss with her pharmacy. (Per previous phone messages this has been advised before already)

## 2021-10-19 NOTE — Telephone Encounter (Signed)
Pt called stating that her medications that she has prescribed from this office are being packaged incorrectly and she states the pharmacy is needing the correct information for them to package them differently. Please advise.

## 2021-10-24 ENCOUNTER — Telehealth: Payer: Self-pay | Admitting: Neurology

## 2021-10-24 NOTE — Telephone Encounter (Signed)
Called the patient's daughter back as of 10/17 it was last prescribed that the lamotrigine 25 mg was 1 tab twice a day for 5 days and then increase to 2 tab in the am and 2 tab in the pm.  So wanted to confirm first that the patient is taking 50 mg in the morning and 50 mg in the evening.   Daughter called back and I was able to confirm that she is taking the 50 mg BID. She is stating that the pt is complaining for the last 3 weeks or so about break through pain in the afternoon. The patient is asking if she can add an additional tablet early afternoon around 3 pm to hold over onto the evening dose.  Advised I would have to question this with Dr Epimenio Foot.   She is close to needing a refill so she wanted to check in for this before sending in the refill. Advised I would have to question and will be back in touch after discussing with Dr Epimenio Foot.

## 2021-10-24 NOTE — Telephone Encounter (Signed)
Pt's daughter, April [place] (on [place]) called, would like a call from the nurse to discuss increasing  lamoTRIgine (LAMICTAL) 25 MG tablet. She want to take extra tablet in the afternoon due to breakthrough pain.

## 2021-10-25 MED ORDER — LAMOTRIGINE 25 MG PO TABS: ORAL_TABLET | ORAL | 5 refills | Status: DC

## 2021-10-25 NOTE — Telephone Encounter (Signed)
Called the daughter back to advise that we discussed with Dr. Epimenio Foot and he is agreeable to adding an additional 25 mg dose in the afternoon.  Advised the daughter I will [facility].  Patient still has some at home.  Advised that she should finish out the medication that she has at home before picking up the new medicine. The daughter was concerned about [facility] since the patient had stated she did not want to do the blister packs anymore from the pharmacy.  I advised that I will go ahead and have the prescription ready there so that way they have it on file when she is due and hopefully the patient will be okay with getting it through [facility] at the time they are due.

## 2021-10-25 NOTE — Addendum Note (Signed)
Addended by: Judi Cong on: 10/25/2021 10:22 AM   Modules accepted: Orders

## 2021-11-02 NOTE — Telephone Encounter (Signed)
Pt has called to report she is having break thru pain.  Pt is asking for a call to discuss other options as to how she takes her lamotrigine .  Also pt is asking that [phone] and not [facility]

## 2021-11-03 NOTE — Telephone Encounter (Signed)
Daughter was made aware of the plan of the call prior to me calling her mom. The only thing daughter was not made aware of is that apt was moved up to 11/16 at 3 p.  If she calls please advise the daughter of pt's new apt time.

## 2021-11-03 NOTE — Telephone Encounter (Signed)
Pt's daughter, April [place] (on [place]) called, would like a call from the nurse discuss the outcome of the phone call my mother make yesterday.

## 2021-11-03 NOTE — Telephone Encounter (Signed)
Called the patient back and advised the patient that we have just made the change to her medication last week but adding additional dose in the afternoon.  Informed the patient that since her appointment is coming up and I was able to get her moved up sooner, we should wait until she has her visit with Dr. Epimenio Foot before we make any additional changes to allow time for the medication to work.  I advised/encouraged the patient to continue to use [facility] because of their packaging of the medication is safer.  Patient insist that they have made multiple mistakes and she does not feel comfortable continuing to use them.  Advised that we would wait until her visit to discuss sending it to an alternative pharmacy in case changes are adjustments to the medicine. Pt was agreeable to this plan. Advised she should inform her daughter about the schedule change.

## 2021-11-09 ENCOUNTER — Ambulatory Visit: Payer: Medicare Other | Admitting: Neurology

## 2021-11-09 ENCOUNTER — Encounter: Payer: Self-pay | Admitting: Neurology

## 2021-11-09 ENCOUNTER — Telehealth: Payer: Self-pay | Admitting: Neurology

## 2021-11-09 VITALS — BP 124/74 | HR 71 | Ht 64.0 in | Wt 141.0 lb

## 2021-11-09 DIAGNOSIS — R413 Other amnesia: Secondary | ICD-10-CM

## 2021-11-09 DIAGNOSIS — F418 Other specified anxiety disorders: Secondary | ICD-10-CM | POA: Diagnosis not present

## 2021-11-09 DIAGNOSIS — G9050 Complex regional pain syndrome I, unspecified: Secondary | ICD-10-CM

## 2021-11-09 DIAGNOSIS — R208 Other disturbances of skin sensation: Secondary | ICD-10-CM

## 2021-11-09 MED ORDER — LAMOTRIGINE 25 MG PO TABS: ORAL_TABLET | ORAL | 5 refills | Status: DC

## 2021-11-09 NOTE — Progress Notes (Signed)
[facility]  PATIENT: Tami Lewis DOB: Sep 13, 1941  REFERRING DOCTOR OR PCP: Tami Laughter, MD SOURCE: Patient, daughter, notes from primary care, imaging and lab reports, imaging studies personally reviewed.  _________________________________   HISTORICAL  CHIEF COMPLAINT:  Chief Complaint  Patient presents with   Follow-up    Rm 2, w husband. Her for medication concerns and effectiveness. Pt also has concerns with memory. Pt reports hand tremors, difficulty writing.     HISTORY OF PRESENT ILLNESS:  Tami Lewis is a 80 year old woman with painful dysesthesias/RSD, and memory loss  Update 11/09/2021 She is having more pain in her right leg pain and also groin (has a prolapse causing a lot of discomfort.     She fell aout 5 weeks ago and pain worsened.     Lamotrigine (50 mg at 10 am, 25 at 330 pm and 50 mg at 10 pm).   It has helped the nerve pain but constipation worsened.   She eats prunes with some benefit.    She also takes some magnesium at night.       She had her pessary removed and uses some silver cream.  It will be reinstalled soon.     She has had difficulties with memory, probably unchanged since the last visit..  She has a lot of anxiety and cognitive testing in 2019 was more consistent with the anxiety playing a larger role in her memory loss.  However, symptoms have progressed.  She is very irritable at times and augmentative.   MMSE - Mini Mental State Exam 08/30/2021  Orientation to time 4  Orientation to Place 5  Registration 3  Attention/ Calculation 1  Recall 1  Language- name 2 objects 2  Language- repeat 1  Language- follow 3 step command 3  Language- read & follow direction 1  Write a sentence 1  Copy design 0  Total score 22      ADDITIONAL HISTORY: I saw her in 2019 for memory loss and or RSD related pain in the right ankle.  She also was having some agitation and anxiety.  I prescribed lamotrigine to titrate up to 100  mg p.o. twice daily.  She never started the medication..  More recently, in May, she was prescribed gabapentin 100 mg p.o. 3 times daily but feels it causes constipation (difficult for her as she has a prolapsed uterus).  In 2019, she had neuro cognitive testing.  She saw Dr. Dimas Lewis who diagnosed severe anxiety but no primary dementia.   Detailed testing, results and recommendations were reviewed.  There is a family history of Alzheimer's disease.  She has some insomnia.    In 2020, she was placed on Paxil and her mood they have done little bit better but she stopped because she felt her tinnitus was worse.  Her daughter reports that she had more issues with her memory starting in 2020.  She became more forgetful.  She would blame others for her mistakes.  She would have episodes of irritability and anger.  She had an evaluation at the [facility] but walked out angry during the exam.  Symptoms progressed a little bit further in 2021.  In March 2022, she was placed on Zoloft but stopped after 2 weeks.  IMAGING: CT scan of the head dated 05/03/2021.  This showed no acute findings.  There was mild atrophy, typical for age.  There was chronic hypodense changes in the right anterior limb of the internal capsule, not  present in 2007.   MRI of the brain 03/28/2006 showed minimal chronic microvascular ischemic change.    REVIEW OF SYSTEMS: Constitutional: No fevers, chills, sweats, or change in appetite Eyes: No visual changes, double vision, eye pain Ear, nose and throat: No hearing loss, ear pain, nasal congestion, sore throat Cardiovascular: No chest pain, palpitations Respiratory:  No shortness of breath at rest or with exertion.   No wheezes GastrointestinaI: No nausea, vomiting, diarrhea, abdominal pain, fecal incontinence Genitourinary:  No dysuria, urinary retention or frequency.  No nocturia. Musculoskeletal:  No neck pain, back pain Integumentary: No rash, pruritus, skin  lesions Neurological: as above Psychiatric: As above endocrine: No palpitations, diaphoresis, change in appetite, change in weigh or increased thirst Hematologic/Lymphatic:  No anemia, purpura, petechiae. Allergic/Immunologic: No itchy/runny eyes, nasal congestion, recent allergic reactions, rashes  ALLERGIES: No Known Allergies  HOME MEDICATIONS:  Current Outpatient Medications:    ALPRAZolam (XANAX) 1 MG tablet, Take 1 mg by mouth 2 (two) times daily., Disp: , Rfl:    ARMOUR THYROID 60 MG tablet, Take 60 mg by mouth daily. 3/4 of a tablet daily, Disp: , Rfl: 3   Ashwagandha 500 MG CAPS, 1-2 per day, Disp: , Rfl:    Bioflavonoid Products (ESTER-C) TABS, See admin instructions., Disp: , Rfl:    Coenzyme Q10 200 MG TABS, 1 tablet with a meal, Disp: , Rfl:    donepezil (ARICEPT) 5 MG tablet, Take 1 tablet (5 mg total) by mouth at bedtime., Disp: 30 tablet, Rfl: 11   Homeopathic Products (ARNICARE) GEL, See admin instructions., Disp: , Rfl:    loratadine (CLARITIN) 10 MG tablet, Take 1 tablet by mouth as needed., Disp: , Rfl:    Magnesium 200 MG TABS, Take 1 tablet by mouth 2 (two) times daily., Disp: , Rfl:    Multiple Vitamins-Minerals (HAIR SKIN & NAILS ADVANCED) TABS, Take 1 tablet by mouth daily., Disp: , Rfl:    NONFORMULARY OR COMPOUNDED ITEM, Dic3%/Bac2%/GAB5%/Lid5%/Menthol1%.   [facility] in [place], [place].  Phone# [phone], Disp: , Rfl:    Omega 3 1000 MG CAPS, Take 1 capsule by mouth daily., Disp: , Rfl:    PREMARIN vaginal cream, APPLY 1/2 GRAM VAGINALLY 2 TIMES A WEEK, Disp: , Rfl: 0   Turmeric Curcumin 500 MG CAPS, as needed., Disp: , Rfl:    lamoTRIgine (LAMICTAL) 25 MG tablet, Take 3 tablet (75 mg) in the morning, 2 tablets (50 mg) around 3 pm, and 3 tablets (75 mg) at bedtime., Disp: 240 tablet, Rfl: 5  PAST MEDICAL HISTORY: Past Medical History:  Diagnosis Date   Hyperthyroidism    Reflex sympathetic dystrophy    Vision abnormalities     PAST SURGICAL  HISTORY: Past Surgical History:  Procedure Laterality Date   TONSILLECTOMY      FAMILY HISTORY: Family History  Problem Relation Age of Onset   Stroke Father    Dementia Paternal Aunt     SOCIAL HISTORY:  Social History   Socioeconomic History   Marital status: Married    Spouse name: Not on file   Number of children: Not on file   Years of education: Not on file   Highest education level: Not on file  Occupational History   Not on file  Tobacco Use   Smoking status: Never   Smokeless tobacco: Never  Substance and Sexual Activity   Alcohol use: No    Comment: 1-2 small glasses of wine per month   Drug use: No   Sexual activity:  Not on file  Other Topics Concern   Not on file  Social History Narrative   Lives with husband   Right handed   Caffeine: 1 cup in the AM   Social Determinants of Health   Financial Resource Strain: Not on file  Food Insecurity: Not on file  Transportation Needs: Not on file  Physical Activity: Not on file  Stress: Not on file  Social Connections: Not on file  Intimate Partner Violence: Not on file     PHYSICAL EXAM  Vitals:   11/09/21 1448  BP: 124/74  Pulse: 71  Weight: 141 lb (64 kg)  Height: 5\' 4"  (1.626 m)    Body mass index is 24.2 kg/m.   General: The patient is well-developed and well-nourished and in no acute distress  HEENT:  Head is /AT.  Sclera are anicteric.  Skin: Extremities are without rash or  edema.  I saw no difference between the left and right ankle region  Neurologic Exam  Mental status: The patient is alert and oriented x 3 at the time of the examination.  Short-term memory was 1/3.    She scored only 1/5 on serial sevens.  Speech is normal.  She scored 22/30 on the Mini-Mental status exam.  Cranial nerves: Extraocular movements are full.   Facial symmetry is present.  .Facial strength is normal.  No obvious hearing deficits are noted.  Motor:  Muscle bulk is normal.   Tone is normal.  Strength is  5 / 5 in all 4 extremities.   Sensory: Sensory testing is intact to pinprick, soft touch and vibration sensation in all 4 extremities.  Coordination: Cerebellar testing reveals good finger-nose-finger and heel-to-shin bilaterally.  Gait and station: Station is normal.   Gait is normal. Tandem gait is mildly wide but normal for age.  She can turn in 3 steps.   Romberg is negative.   Reflexes: Deep tendon reflexes are symmetric and normal bilaterally.        ASSESSMENT AND PLAN  RSD (reflex sympathetic dystrophy)  Memory loss  Dysesthesia  Depression with anxiety   We will increase lamotrigine to 75 mg - 50 mg - 75 mg to help the dysesthesias further.    She scored 22/30 on the Mini-Mental Status test at the last visit putting her in the mild dementia range.  Brain volume was normal for age on CT scan earlier this year so probably not AD.   She has h/o mood disorders and that may be playing a role.   Donepezil for memory.  It may help her constipation also. Follow-up in 6 months   40-minute office visit with the majority of the time spent face-to-face for history and physical, discussion/counseling and decision-making.  Additional time with record review and documentation.    Javeion Cannedy A. , MD, [facility] 11/09/2021, 3:43 PM Certified in Neurology, Clinical Neurophysiology, Sleep Medicine and Neuroimaging  [facility] [address] [place], [place] [phone]

## 2021-11-09 NOTE — Telephone Encounter (Signed)
Pt's daughter April called stating she will not be here with her mother for her appt her dad will. Asking to be called after the appt is over to discuss what went on in the appt because she is not sure if her dad will go back with her mom. She is also asking Dr. Epimenio Foot to advise her mother not to drive since she happens to forget things now. April requesting a call back.

## 2021-11-10 DIAGNOSIS — Z4689 Encounter for fitting and adjustment of other specified devices: Secondary | ICD-10-CM | POA: Diagnosis not present

## 2021-11-10 DIAGNOSIS — N8111 Cystocele, midline: Secondary | ICD-10-CM | POA: Diagnosis not present

## 2021-11-10 NOTE — Telephone Encounter (Signed)
Pt's daughter called back wanting to know when she will be called back with the information from the pt's OV. She states the pt has already messed up her medications and April is needing to discuss with RN.

## 2021-11-10 NOTE — Telephone Encounter (Signed)
Called the daughter and reviewed the office notes and plan of care for her mom with her. She was concerned about her getting the lamictal at both pharmacies. I advised that she insurance will not allow her to get more than what is allowed and ordered.  She was appreciative for the call back and had no other questions

## 2021-11-11 DIAGNOSIS — Z4689 Encounter for fitting and adjustment of other specified devices: Secondary | ICD-10-CM | POA: Diagnosis not present

## 2021-11-11 DIAGNOSIS — N8111 Cystocele, midline: Secondary | ICD-10-CM | POA: Diagnosis not present

## 2021-11-15 ENCOUNTER — Ambulatory Visit: Payer: Medicare Other | Admitting: Neurology

## 2021-11-24 ENCOUNTER — Telehealth: Payer: Self-pay | Admitting: Neurology

## 2021-11-24 MED ORDER — DONEPEZIL HCL 5 MG PO TABS: 5.0000 mg | ORAL_TABLET | Freq: Every day | ORAL | 11 refills | Status: DC

## 2021-11-24 NOTE — Telephone Encounter (Signed)
Refill has been sent for the patient to the pharmacy she is requesting

## 2021-11-24 NOTE — Telephone Encounter (Signed)
Pt called states she no longer uses the [facility] they have made too many mistakes. Pt is needing a refill of the donepezil (ARICEPT) 5 MG tablet. Pharmacy [facility] [phone] - [place], [place] - [address] AT [address] OF [address]

## 2021-12-01 ENCOUNTER — Other Ambulatory Visit: Payer: Self-pay | Admitting: Neurology

## 2021-12-01 ENCOUNTER — Telehealth: Payer: Self-pay | Admitting: Neurology

## 2021-12-01 DIAGNOSIS — F418 Other specified anxiety disorders: Secondary | ICD-10-CM

## 2021-12-01 DIAGNOSIS — R413 Other amnesia: Secondary | ICD-10-CM

## 2021-12-01 MED ORDER — HYDROXYZINE HCL 10 MG PO TABS: 10.0000 mg | ORAL_TABLET | Freq: Three times a day (TID) | ORAL | 0 refills | Status: DC

## 2021-12-01 NOTE — Addendum Note (Signed)
Addended by: Judi Cong on: 12/01/2021 05:20 PM   Modules accepted: Orders

## 2021-12-01 NOTE — Telephone Encounter (Signed)
Called the patient's daughter and advised that in reviewing the letter with her concerns and the message that was left, Dr Epimenio Foot recommends offering a medication hydroxyzine that can be helpful with anxiety. Advised the pt's daughter if this isn't helpful from neurology standpoint we would need to refer out to neuro cognitive or gerontology.  She is having a hard time understanding how we are going to go about completing this and her being agreeable and I advised that all we can do is offer the recommendation and they will have to work with her on moving forward with treatment. Advised that if the pt becomes harmful to herself or others than may even need to get behavior health involved. She verbalized understanding

## 2021-12-01 NOTE — Telephone Encounter (Signed)
Called the daughter. There was no answer LVM advising pt to call back.

## 2021-12-01 NOTE — Telephone Encounter (Signed)
Pt's daughter is asking for a call to discuss pt worsening within the last week.  Pt is in a bit of a rage, packing clothes, argumentative, not trusting anyone, threatening divorce.  Daughter is bringing a letter for Dr Epimenio Foot to see what all is going on.  Daughter has still asked for a call from RN

## 2021-12-07 ENCOUNTER — Telehealth: Payer: Self-pay | Admitting: Neurology

## 2021-12-07 NOTE — Telephone Encounter (Signed)
Pt's daughter called stating the NEUROPSYCHOLOGY cannot get her mother in until May. Wanting to know could she be referred somewhere else. April is requesting a call back.

## 2021-12-07 NOTE — Telephone Encounter (Signed)
Is there any where else that can get her in sooner to be seen?

## 2021-12-08 ENCOUNTER — Telehealth: Payer: Self-pay | Admitting: Neurology

## 2021-12-08 MED ORDER — OLANZAPINE 5 MG PO TABS: 5.0000 mg | ORAL_TABLET | Freq: Every day | ORAL | 5 refills | Status: DC

## 2021-12-08 NOTE — Telephone Encounter (Signed)
I spoke to her daughter April.  Tami Lewis has had more paranoia towards her family and packed a bag and left the house earlier today.  She had told someone that she was going to the fire department nearby and that is where she was.  Her daughter now has her safely back home.  Additionally, Tami Lewis's son is also in town.  She has a mild dementia but has had behavioral problems and more recent paranoia worsening over the past few months.  I most concerned about the possibility of frontotemporal dementia.  Her presentation would be less typical for classic Alzheimer's disease.  I had previously written for lamotrigine in the hope that it would help both her dysesthesias as well as help as a mood stabilizer.  However, it does not seem to be helping her paranoia.  I have also written for as needed hydroxyzine but she has not taken any and she is suspicious about taking any medication.  I will send in a prescription for olanzapine 5 mg to take 1 daily as a daily medication might be more effective than an as needed medication.  I also discussed that if she worsens further the family may want to take her to the behavioral health urgent care ([address]

## 2021-12-08 NOTE — Telephone Encounter (Signed)
Not likely. This is standard for neuropsych testing unfortunately. I will send to [facility]. Patient may have to pay out of pocket but it has a shorter wait time. Phone: [phone].

## 2021-12-08 NOTE — Telephone Encounter (Signed)
I was able to speak with patient's daughter regarding patient's condition.  She has become more confused, memory loss has been worse.  They had to call 911 earlier.  She is back home but they are still working on getting her evaluated for a change in her behavior and mental status.  I recommended that she be taken to the nearest emergency room to be evaluated for acute mental status changes and behavioral changes.  Patient's daughter demonstrated understanding and agreement.

## 2021-12-08 NOTE — Telephone Encounter (Signed)
I received an after-hours call message from patient's daughter, April Lawrence, reporting the following: Mom packed bags and the left house.  Need to know what to do because she has dementia.  I tried calling back the number listed.  I got a voicemail which did not specify who this phone belonged to.  I left a generic message as to where I was calling from and who I was.    Please call back and inquire.  I will try again a little later as well. My recommendation is to call 911, if the patient is not redirectable to come back home, I am not sure if she left in a car or by foot.

## 2021-12-09 ENCOUNTER — Emergency Department (HOSPITAL_COMMUNITY)
Admission: EM | Admit: 2021-12-09 | Discharge: 2021-12-11 | Disposition: A | Discharge status: Home / Self Care | Payer: Medicare Other | Attending: Emergency Medicine | Admitting: Emergency Medicine

## 2021-12-09 ENCOUNTER — Encounter (HOSPITAL_COMMUNITY): Payer: Self-pay | Admitting: Emergency Medicine

## 2021-12-09 ENCOUNTER — Other Ambulatory Visit: Payer: Self-pay

## 2021-12-09 DIAGNOSIS — Z046 Encounter for general psychiatric examination, requested by authority: Secondary | ICD-10-CM | POA: Diagnosis not present

## 2021-12-09 DIAGNOSIS — F0390 Unspecified dementia without behavioral disturbance: Secondary | ICD-10-CM | POA: Diagnosis present

## 2021-12-09 DIAGNOSIS — Z743 Need for continuous supervision: Secondary | ICD-10-CM | POA: Diagnosis not present

## 2021-12-09 DIAGNOSIS — Z20822 Contact with and (suspected) exposure to covid-19: Secondary | ICD-10-CM | POA: Insufficient documentation

## 2021-12-09 DIAGNOSIS — F03918 Unspecified dementia, unspecified severity, with other behavioral disturbance: Secondary | ICD-10-CM

## 2021-12-09 DIAGNOSIS — E039 Hypothyroidism, unspecified: Secondary | ICD-10-CM | POA: Diagnosis not present

## 2021-12-09 DIAGNOSIS — R9431 Abnormal electrocardiogram [ECG] [EKG]: Secondary | ICD-10-CM | POA: Diagnosis not present

## 2021-12-09 DIAGNOSIS — G309 Alzheimer's disease, unspecified: Secondary | ICD-10-CM

## 2021-12-09 DIAGNOSIS — Y900 Blood alcohol level of less than 20 mg/100 ml: Secondary | ICD-10-CM | POA: Insufficient documentation

## 2021-12-09 DIAGNOSIS — R4182 Altered mental status, unspecified: Secondary | ICD-10-CM | POA: Diagnosis present

## 2021-12-09 LAB — COMPREHENSIVE METABOLIC PANEL
ALT: 20 U/L (ref 0–44)
AST: 28 U/L (ref 15–41)
Albumin: 3.9 g/dL (ref 3.5–5.0)
Alkaline Phosphatase: 57 U/L (ref 38–126)
Anion gap: 7 (ref 5–15)
BUN: 16 mg/dL (ref 8–23)
CO2: 25 mmol/L (ref 22–32)
Calcium: 8.6 mg/dL — ABNORMAL LOW (ref 8.9–10.3)
Chloride: 107 mmol/L (ref 98–111)
Creatinine, Ser: 0.77 mg/dL (ref 0.44–1.00)
GFR, Estimated: >60 mL/min (ref >60)
Glucose, Bld: 128 mg/dL — ABNORMAL HIGH (ref 70–99)
Potassium: 2.9 mmol/L — ABNORMAL LOW (ref 3.5–5.1)
Sodium: 139 mmol/L (ref 135–145)
Total Bilirubin: 0.6 mg/dL (ref 0.3–1.2)
Total Protein: 6.2 g/dL — ABNORMAL LOW (ref 6.5–8.1)

## 2021-12-09 LAB — T4, FREE: Free T4: 1.03 ng/dL (ref 0.61–1.12)

## 2021-12-09 LAB — URINALYSIS, ROUTINE W REFLEX MICROSCOPIC
Bilirubin Urine: NEGATIVE
Glucose, UA: NEGATIVE mg/dL
Hgb urine dipstick: NEGATIVE
Ketones, ur: 5 mg/dL — AB
Nitrite: NEGATIVE
Protein, ur: NEGATIVE mg/dL
Specific Gravity, Urine: 1.014 (ref 1.005–1.030)
pH: 6 (ref 5.0–8.0)

## 2021-12-09 LAB — RAPID URINE DRUG SCREEN, HOSP PERFORMED
Amphetamines: NOT DETECTED
Barbiturates: NOT DETECTED
Benzodiazepines: POSITIVE — AB
Cocaine: NOT DETECTED
Opiates: NOT DETECTED
Tetrahydrocannabinol: NOT DETECTED

## 2021-12-09 LAB — CBC WITH DIFFERENTIAL/PLATELET
Abs Immature Granulocytes: 0.02 10*3/uL (ref 0.00–0.07)
Basophils Absolute: 0 10*3/uL (ref 0.0–0.1)
Basophils Relative: 0 %
Eosinophils Absolute: 0.2 10*3/uL (ref 0.0–0.5)
Eosinophils Relative: 2 %
HCT: 37.8 % (ref 36.0–46.0)
Hemoglobin: 12.1 g/dL (ref 12.0–15.0)
Immature Granulocytes: 0 %
Lymphocytes Relative: 31 %
Lymphs Abs: 2.1 10*3/uL (ref 0.7–4.0)
MCH: 31.8 pg (ref 26.0–34.0)
MCHC: 32 g/dL (ref 30.0–36.0)
MCV: 99.5 fL (ref 80.0–100.0)
Monocytes Absolute: 0.5 10*3/uL (ref 0.1–1.0)
Monocytes Relative: 8 %
Neutro Abs: 3.9 10*3/uL (ref 1.7–7.7)
Neutrophils Relative %: 59 %
Platelets: 475 10*3/uL — ABNORMAL HIGH (ref 150–400)
RBC: 3.8 MIL/uL — ABNORMAL LOW (ref 3.87–5.11)
RDW: 13.9 % (ref 11.5–15.5)
WBC: 6.8 10*3/uL (ref 4.0–10.5)
nRBC: 0 % (ref 0.0–0.2)

## 2021-12-09 LAB — RESP PANEL BY RT-PCR (FLU A&B, COVID) ARPGX2
Influenza A by PCR: NEGATIVE
Influenza B by PCR: NEGATIVE
SARS Coronavirus 2 by RT PCR: NEGATIVE

## 2021-12-09 LAB — ETHANOL: Alcohol, Ethyl (B): <10 mg/dL (ref <10)

## 2021-12-09 LAB — TSH: TSH: 4.132 u[IU]/mL (ref 0.350–4.500)

## 2021-12-09 MED ORDER — POTASSIUM CHLORIDE CRYS ER 20 MEQ PO TBCR
40.0000 meq | EXTENDED_RELEASE_TABLET | Freq: Once | ORAL | Status: AC
  Administered 2021-12-09: 40 meq via ORAL
  Filled 2021-12-09: qty 2

## 2021-12-09 MED ORDER — NITROFURANTOIN MONOHYD MACRO 100 MG PO CAPS
100.0000 mg | ORAL_CAPSULE | Freq: Two times a day (BID) | ORAL | Status: DC
  Administered 2021-12-09 – 2021-12-11 (×4): 100 mg via ORAL
  Filled 2021-12-09 (×5): qty 1

## 2021-12-09 MED ORDER — ALPRAZOLAM 0.5 MG PO TABS
0.5000 mg | ORAL_TABLET | Freq: Every day | ORAL | Status: DC
  Administered 2021-12-10 – 2021-12-11 (×2): 0.5 mg via ORAL
  Filled 2021-12-09 (×2): qty 1

## 2021-12-09 MED ORDER — LAMOTRIGINE 25 MG PO TABS: 50.0000 mg | ORAL_TABLET | ORAL | Status: DC

## 2021-12-09 MED ORDER — ALPRAZOLAM 0.5 MG PO TABS
2.0000 mg | ORAL_TABLET | Freq: Every day | ORAL | Status: DC
  Administered 2021-12-09 – 2021-12-10 (×2): 2 mg via ORAL
  Filled 2021-12-09 (×2): qty 4

## 2021-12-09 MED ORDER — OLANZAPINE 5 MG PO TBDP
2.5000 mg | ORAL_TABLET | Freq: Two times a day (BID) | ORAL | Status: DC
  Administered 2021-12-09 – 2021-12-11 (×5): 2.5 mg via ORAL
  Filled 2021-12-09 (×5): qty 1

## 2021-12-09 MED ORDER — ALPHA-LIPOIC ACID 600 MG PO TABS: 600.0000 mg | ORAL_TABLET | Freq: Two times a day (BID) | ORAL | Status: DC

## 2021-12-09 MED ORDER — LORAZEPAM 0.5 MG PO TABS
0.5000 mg | ORAL_TABLET | Freq: Once | ORAL | Status: AC
  Administered 2021-12-09: 0.5 mg via ORAL
  Filled 2021-12-09: qty 1

## 2021-12-09 MED ORDER — OLANZAPINE 5 MG PO TBDP
2.5000 mg | ORAL_TABLET | Freq: Once | ORAL | Status: AC
  Administered 2021-12-09: 2.5 mg via ORAL
  Filled 2021-12-09: qty 1

## 2021-12-09 MED ORDER — POLYETHYLENE GLYCOL 3350 17 G PO PACK
17.0000 g | PACK | Freq: Every day | ORAL | Status: DC | PRN
Start: 2021-12-09 — End: 2021-12-12
  Administered 2021-12-10 – 2021-12-11 (×2): 17 g via ORAL
  Filled 2021-12-09 (×2): qty 1

## 2021-12-09 MED ORDER — LIDOCAINE 5 % EX PTCH: 1.0000 | MEDICATED_PATCH | Freq: Every day | CUTANEOUS | Status: DC | PRN

## 2021-12-09 MED ORDER — DONEPEZIL HCL 5 MG PO TABS
5.0000 mg | ORAL_TABLET | Freq: Every day | ORAL | Status: DC
  Administered 2021-12-09 – 2021-12-10 (×2): 5 mg via ORAL
  Filled 2021-12-09 (×2): qty 1

## 2021-12-09 MED ORDER — ALPRAZOLAM 0.5 MG PO TABS: 0.5000 mg | ORAL_TABLET | ORAL | Status: DC

## 2021-12-09 MED ORDER — LAMOTRIGINE 25 MG PO TABS
75.0000 mg | ORAL_TABLET | Freq: Two times a day (BID) | ORAL | Status: DC
  Administered 2021-12-09 – 2021-12-11 (×4): 75 mg via ORAL
  Filled 2021-12-09 (×4): qty 3

## 2021-12-09 MED ORDER — HYDROXYZINE HCL 10 MG PO TABS
10.0000 mg | ORAL_TABLET | Freq: Three times a day (TID) | ORAL | Status: DC
  Administered 2021-12-09 – 2021-12-11 (×8): 10 mg via ORAL
  Filled 2021-12-09 (×8): qty 1

## 2021-12-09 MED ORDER — LAMOTRIGINE 25 MG PO TABS
50.0000 mg | ORAL_TABLET | Freq: Every day | ORAL | Status: DC
  Administered 2021-12-09 – 2021-12-11 (×3): 50 mg via ORAL
  Filled 2021-12-09 (×3): qty 2

## 2021-12-09 NOTE — ED Provider Notes (Signed)
°  Provider Note MRN:  [id number]  Arrival date & time: 12/09/21    ED Course and Medical Decision Making  Assumed care from [facility] at shift change.  See not from prior team for complete details, in brief: pt unsafe at home, wandering, leaving home, worsening paranoia. Hx dementia. Unsafe at home per family.   Plan per prior physician medical clearance and geri-psych assessment  Pt medically cleared  TTS has evaluated the PT, recommend geri psych inpt. This is reasonable. Pending placement currently.   Procedures  Final Clinical Impressions(s) / ED Diagnoses     ICD-10-CM   1. Dementia with other behavioral disturbance, unspecified dementia severity, unspecified dementia type  F03.918       ED Discharge Orders     None       Discharge Instructions   None        Sloan Leiter, DO 12/09/21 1620

## 2021-12-09 NOTE — ED Triage Notes (Signed)
Per [facility] patient is being IVC'd d/t her having dementia and keeps walking off and getting lost. She is being IVC'd by her husband. Per [facility], she knew her name and place.

## 2021-12-09 NOTE — ED Notes (Signed)
[phone],

## 2021-12-09 NOTE — ED Notes (Signed)
Pt. Family son(Steve) daughter(April) took pt  2 bags belongings with them to home. Family was given jewelery(2 yellow bracelets, 1 silver ring, 1 beaded bracelet. Pt. Has only glasses at bedside.

## 2021-12-09 NOTE — ED Notes (Signed)
Pt was given water to help with urination.

## 2021-12-09 NOTE — BH Assessment (Signed)
Comprehensive Clinical Assessment (CCA) Note  12/09/2021 Tami Lewis [id number]  Chief Complaint:  Chief Complaint  Patient presents with   Altered Mental Status    Patient is being IVC'd   Visit Diagnosis:   F41.1 Generalized anxiety disorder F32.1 Major depressive disorder, Single episode, Moderate Alzheimer's disease with behavorial disturbance     Flowsheet Row ED from 12/09/2021 in [place] DEPT  C-SSRS RISK CATEGORY No Risk      The patient demonstrates the following risk factors for suicide: Chronic risk factors for suicide include: psychiatric disorder of Anxiety disorder, and Dementia and chronic pain. Acute risk factors for suicide include: social withdrawal/isolation and loss (financial, interpersonal, professional). Protective factors for this patient include: positive social support, positive therapeutic relationship, and coping skills. Considering these factors, the overall suicide risk at this point appears to be no risk. Patient is not appropriate for outpatient follow up.  Disposition: Tami Fava NP, recommends geropsychiatric inpatient treatment.  Disposition CSW to seek placement.  Deposition discussed with Scientist, clinical (histocompatibility and immunogenetics).  RN to discuss disposition with EDP.  Tami Lewis. Tami Lewis, is a 80 years patient who presents involuntarily to [facility], via [facility]  and accompanied by her husband, Tami Lewis, [phone].  TTS attempted to contact, Pt's husband, left hipp message to contact. Pt IVC reads "Threaten to go to [facility] and jump off bridge, has been diagnosed with dementia, delusional and talking to herself, has left the home before,very angry, has problems processing issues, judgment and impairment and has withdrawn from social activities.  Pt denied SI, HI, or AVH.  Pt reports frustrations, "things are disappearing around me such as my phone and car keys, I put my phone in my junk drawer, I forgot I put it in there". Pt reports " I  have been disruptive, due to COVID".  Pt acknowledged the following symptoms: sadness, restlessness, isolating, fatigue, irritable, worrying, and difficult concentrating.  Pt reports that she has been sleeping fine during the night; also reports that her appetite  is fine. Pt denied drinking alcohol or using any other substance.  Pt identifies her primary stressor as with her sister being diagnose with demential and four deaths in the family (bother in-law and cousin).  Pt reports that she lives with her husband.  Pt reports no family history of mental illness or substance used. Pt denies an history of abuse or trauma.  Pt denies any current legal problems. Pt reports no guns in the home.  Pt says she is not currently receiving weekly outpatient therapy; Pt stated that she was receiving outpatient medication (Xanax) in 1990.  Pt reports no previous inpatient psychiatric hospitalization.  Pt reports she have a RSD diagnosis.  Pt is dressed in scrubs, alert,oriented x 4 with slow and soft speech. Pt eye contact is normal.  Pt mood is depressed and dysphoric.  Pt affects is tearful.  Thought process is distorted and ruminations.  Pt's insight is lacking and judgment fair.  There is no indication Pt's currently responding to internal stimuli or experiencing delusional thought content.  Pt was cooperative throughout assessment.  CCA Screening, Triage and Referral (STR)  Patient Reported Information How did you hear about [place]? Family/Friend  What Is the Reason for Your Visit/Call Today? SI, Per IVC  How Long Has This Been Causing You Problems? <Week  What Do You Feel Would Help You the Most Today? Treatment for Depression or other mood problem; Social Support   Have You Recently Had Any Thoughts About Hurting  Yourself? No (Per IVC, Threaten to go to [name] an jump off bridge)  Are You Planning to Commit Suicide/Harm Yourself At This time? No   Have you Recently Had Thoughts About [place]? No  Are You Planning to Harm Someone at This Time? No  Explanation: No data recorded  Have You Used Any Alcohol or Drugs in the Past 24 Hours? No  How Long Ago Did You Use Drugs or Alcohol? No data recorded What Did You Use and How Much? No data recorded  Do You Currently Have a Therapist/Psychiatrist? No  Name of Therapist/Psychiatrist: No data recorded  Have You Been Recently Discharged From Any Office Practice or Programs? No  Explanation of Discharge From Practice/Program: No data recorded    CCA Screening Triage Referral Assessment Type of Contact: Tele-Assessment  Telemedicine Service Delivery: Telemedicine service delivery: This service was provided via telemedicine using a 2-way, interactive audio and video technology  Is this Initial or Reassessment? Initial Assessment  Date Telepsych consult ordered in [facility]:  12/09/21  Time Telepsych consult ordered in [facility]:  No data recorded Location of Assessment: [facility]  Provider Location: [facility] Assessment Services   Collateral Involvement: TTS attempted to contact, Tami Lewis, husband, [phone], unable to speak with him at present moment.   Does Patient Have a Stage manager Guardian? No data recorded Name and Contact of Legal Guardian: No data recorded If Minor and Not Living with Parent(s), Who has Custody? n/a  Is CPS involved or ever been involved? Never  Is APS involved or ever been involved? Never   Patient Determined To Be At Risk for Harm To Self or Others Based on Review of Patient Reported Information or Presenting Complaint? Yes, for Self-Harm  Method: No data recorded Availability of Means: No data recorded Intent: No data recorded Notification Required: No data recorded Additional Information for Danger to Others Potential: No data recorded Additional Comments for Danger to Others Potential: No data recorded Are There Guns or Other Weapons in Your Home? No data recorded Types of  Guns/Weapons: No data recorded Are These Weapons Safely Secured?                            No data recorded Who Could Verify You Are Able To Have These Secured: No data recorded Do You Have any Outstanding Charges, Pending Court Dates, Parole/Probation? No data recorded Contacted To Inform of Risk of Harm To Self or Others: Family/Significant Other:    Does Patient Present under Involuntary Commitment? Yes  IVC Papers Initial File Date: 12/09/21   [place] of Residence: [place]   Patient Currently Receiving the Following Services: Not Receiving Services   Determination of Need: Emergent (2 hours)   Options For Referral: Inpatient Hospitalization     CCA Biopsychosocial Patient Reported Schizophrenia/Schizoaffective Diagnosis in Past: No   Strengths: Demonstrating respect for others   Mental Health Symptoms Depression:   Difficulty Concentrating; Fatigue   Duration of Depressive symptoms:  Duration of Depressive Symptoms: Less than two weeks   Mania:   None   Anxiety:    Restlessness; Irritability; Fatigue; Worrying; Difficulty concentrating   Psychosis:   None   Duration of Psychotic symptoms:    Trauma:   Re-experience of traumatic event (Pt reports that she sad, due to sisters demential and several deaths in the family.)   Obsessions:   None   Compulsions:   None   Inattention:  None   Hyperactivity/Impulsivity:   None   Oppositional/Defiant Behaviors:   None   Emotional Irregularity:   Transient, stress-related paranoia/disassociation; Mood lability   Other Mood/Personality Symptoms:   Problems concentraing indecisiveness mood    Mental Status Exam Appearance and self-care  Stature:   Average   Weight:   Average weight   Clothing:   -- (Pt dressed in scrubs)   Grooming:  No data recorded  Cosmetic use:  No data recorded  Posture/gait:   Normal   Motor activity:   Repetitive; Restless; Slowed   Sensorium  Attention:    Confused   Concentration:   Focuses on irrelevancies; Preoccupied; Scattered   Orientation:   Object; Person; Place   Recall/memory:   Defective in Short-term; Defective in Recent   Affect and Mood  Affect:   Tearful   Mood:   Depressed; Dysphoric   Relating  Eye contact:   Normal   Facial expression:   Sad; Responsive   Attitude toward examiner:   Cooperative   Thought and Language  Speech flow:  Slow; Soft   Thought content:   Personalizations   Preoccupation:   Ruminations   Hallucinations:   None   Organization:  No data recorded  [employer] of Knowledge:   Fair   Intelligence:   Average   Abstraction:   Normal   Judgement:   Fair   Art therapist:   Distorted; Variable   Insight:   Uses connections; Lacking   Decision Making:   Confused   Social Functioning  Social Maturity:   Isolates   Social Judgement:   Victimized   Stress  Stressors:   Family conflict; Grief/losses   Coping Ability:   Overwhelmed   Skill Deficits:   Decision making; Interpersonal; Self-care   Supports:   Friends/Service system     Religion: Religion/Spirituality How Might This Affect Treatment?: UTA  Leisure/Recreation: Leisure / Recreation Do You Have Hobbies?: Yes Leisure and Hobbies: violin  Exercise/Diet: Exercise/Diet Do You Exercise?: Yes What Type of Exercise Do You Do?: Other (Comment) (Streches) How Many Times a Week Do You Exercise?: Daily Have You Gained or Lost A Significant Amount of Weight in the Past Six Months?: No Do You Follow a Special Diet?: No Do You Have Any Trouble Sleeping?: No   CCA Employment/Education Employment/Work Situation: Employment / Work Nurse, children's Situation: Retired Social research officer, government has Been Impacted by Current Illness: No Has Patient ever Been in Passenger transport manager?: No  Education: Education Is Patient Currently Attending School?: No Last Grade Completed: 12 Did You  Nutritional therapist?: Yes What Type of College Degree Do you Have?: [employer], Double Major/bookeeping Did You Have An Individualized Education Program (IIEP): No Did You Have Any Difficulty At School?: No Patient's Education Has Been Impacted by Current Illness: No   CCA Family/Childhood History Family and Relationship History: Family history Marital status: Married Number of Years Married:  (UTA) What types of issues is patient dealing with in the relationship?: UTA Additional relationship information: UTA Does patient have children?: Yes How many children?: 3 How is patient's relationship with their children?: close  Childhood History:  Childhood History By whom was/is the patient raised?:  (UTA) Did patient suffer any verbal/emotional/physical/sexual abuse as a child?: No Did patient suffer from severe childhood neglect?: No Has patient ever been sexually abused/assaulted/raped as an adolescent or adult?: No Was the patient ever a victim of a crime or a disaster?: No Witnessed domestic violence?: No Has patient been  affected by domestic violence as an adult?: No  Child/Adolescent Assessment:     CCA Substance Use Alcohol/Drug Use: Alcohol / Drug Use Pain Medications: See MRA Prescriptions: See MRA Over the Counter: See MRA History of alcohol / drug use?: No history of alcohol / drug abuse                         ASAM's:  Six Dimensions of Multidimensional Assessment  Dimension 1:  Acute Intoxication and/or Withdrawal Potential:      Dimension 2:  Biomedical Conditions and Complications:      Dimension 3:  Emotional, Behavioral, or Cognitive Conditions and Complications:     Dimension 4:  Readiness to Change:     Dimension 5:  Relapse, Continued use, or Continued Problem Potential:     Dimension 6:  Recovery/Living Environment:     ASAM Severity Score:    ASAM Recommended Level of Treatment:     Substance use Disorder (SUD)    Recommendations  for Services/Supports/Treatments: Recommendations for Services/Supports/Treatments Recommendations For Services/Supports/Treatments: Inpatient Hospitalization, Medication Management  Discharge Disposition:    DSM5 Diagnoses: Patient Active Problem List   Diagnosis Date Noted   Depression with anxiety 07/05/2021   Dysesthesia 08/08/2018   RSD (reflex sympathetic dystrophy) 08/08/2018   Pain in both lower extremities 08/08/2018   Memory loss 01/29/2017     Referrals to Alternative Service(s): Referred to Alternative Service(s):   Place:   Date:   Time:    Referred to Alternative Service(s):   Place:   Date:   Time:    Referred to Alternative Service(s):   Place:   Date:   Time:    Referred to Alternative Service(s):   Place:   Date:   Time:     Meryle Ready, Counselor

## 2021-12-09 NOTE — ED Notes (Signed)
Pt. In burgundy scrubs and wanded by security. Pt. Belongings taken home by family.

## 2021-12-09 NOTE — ED Provider Notes (Signed)
2330. Called by nursing for prn med for agitation.  Patient pacing halls.  Records reviewed.  UA concerning for cystitis, will treat with macrobid.  Will treat pt's agitation with zyprexa, ativan.     Tilden Fossa, MD 12/10/21 [phone]

## 2021-12-09 NOTE — ED Provider Notes (Signed)
[facility] Emergency Department Provider Note MRN:  [id number]  Arrival date & time: 12/09/21     Chief Complaint   Altered Mental Status (Patient is being IVC'd)   History of Present Illness   Tami Lewis is a 80 y.o. year-old female with a history of RSD, chronic pain presenting to the ED with chief complaint of altered mental status.  Patient with dementia for the past 5 years, worsening over the past 1 year.  More recent behavioral disturbance.  Outbursts of anger, paranoid delusions over the past 3 months, usually about how she is no longer allowed to drive or accusing family of stealing her credit cards or jewelry when in fact she is misplacing things around the house.  She has made a few attempts to leave the house over the past couple days, which would be very unsafe.  IVC paperwork initiated by family.  History obtained from patient's son and daughter.  I was unable to obtain an accurate HPI, PMH, or ROS due to the patient's dementia.  Level 5 caveat.  Review of Systems  Positive for dementia, behavioral disturbance.  Patient's Health History    Past Medical History:  Diagnosis Date   Hyperthyroidism    Reflex sympathetic dystrophy    Vision abnormalities     Past Surgical History:  Procedure Laterality Date   TONSILLECTOMY      Family History  Problem Relation Age of Onset   Stroke Father    Dementia Paternal Aunt     Social History   Socioeconomic History   Marital status: Married    Spouse name: Not on file   Number of children: Not on file   Years of education: Not on file   Highest education level: Not on file  Occupational History   Not on file  Tobacco Use   Smoking status: Never   Smokeless tobacco: Never  Substance and Sexual Activity   Alcohol use: No    Comment: 1-2 small glasses of wine per month   Drug use: No   Sexual activity: Not on file  Other Topics Concern   Not on file  Social History Narrative    Lives with husband   Right handed   Caffeine: 1 cup in the AM   Social Determinants of Health   Financial Resource Strain: Not on file  Food Insecurity: Not on file  Transportation Needs: Not on file  Physical Activity: Not on file  Stress: Not on file  Social Connections: Not on file  Intimate Partner Violence: Not on file     Physical Exam   Vitals:   12/09/21 0432  BP: 120/71  Pulse: 67  Resp: 18  Temp: 97.6 F (36.4 C)  SpO2: 97%    CONSTITUTIONAL: Well-appearing, NAD NEURO:  Alert and oriented x 3, no focal deficits EYES:  eyes equal and reactive ENT/NECK:  no LAD, no JVD CARDIO: Regular rate, well-perfused, normal S1 and S2 PULM:  CTAB no wheezing or rhonchi GI/GU:  normal bowel sounds, non-distended, non-tender MSK/SPINE:  No gross deformities, no edema SKIN:  no rash, atraumatic PSYCH:  Appropriate speech and behavior  *Additional and/or pertinent findings included in MDM below  Diagnostic and Interventional Summary    EKG Interpretation  Date/Time:    Ventricular Rate:    PR Interval:    QRS Duration:   QT Interval:    QTC Calculation:   R Axis:     Text Interpretation:  Labs Reviewed  COMPREHENSIVE METABOLIC PANEL - Abnormal; Notable for the following components:      Result Value   Potassium 2.9 (*)    Glucose, Bld 128 (*)    Calcium 8.6 (*)    Total Protein 6.2 (*)    All other components within normal limits  CBC WITH DIFFERENTIAL/PLATELET - Abnormal; Notable for the following components:   RBC 3.80 (*)    Platelets 475 (*)    All other components within normal limits  RESP PANEL BY RT-PCR (FLU A&B, COVID) ARPGX2  ETHANOL  RAPID URINE DRUG SCREEN, HOSP PERFORMED    No orders to display    Medications  potassium chloride SA (KLOR-CON M) CR tablet 40 mEq (has no administration in time range)     Procedures  /  Critical Care Procedures  ED Course and Medical Decision Making  I have reviewed the triage vital signs, the  nursing notes, and pertinent available records from the EMR.  Listed above are laboratory and imaging tests that I personally ordered, reviewed, and interpreted and then considered in my medical decision making (see below for details).  Suspect worsening dementia with behavioral disturbance, getting to the point where she is unsafe at home, especially given the lack of home health support.  Will consult TTS for [facility].  Awaiting medical work-up with screening labs, urinalysis.     Labs reassuring, awaiting urinalysis, anticipating medical clearance and inpatient psychiatric management.  Signed out to oncoming provider at shift change.  Elmer Sow. Pilar Plate, MD [facility] Emergency Medicine [facility] [email or web address]  Final Clinical Impressions(s) / ED Diagnoses     ICD-10-CM   1. Dementia with other behavioral disturbance, unspecified dementia severity, unspecified dementia type  F03.918       ED Discharge Orders     None        Discharge Instructions Discussed with and Provided to Patient:   Discharge Instructions   None       Sabas Sous, MD 12/09/21 [phone]

## 2021-12-09 NOTE — ED Notes (Signed)
TTS done 

## 2021-12-09 NOTE — BH Assessment (Addendum)
[facility] Assessment Progress Note   Per Caryn Bee, NP, this pt requires psychiatric hospitalization at a facility providing specialty care for geriatric patients this time.   Pt presents under IVC initiated by pt's spouse and upheld by EDP Tanda Rockers, DO.  [facility] will not be able to accommodate this pt at this time.  At the direction of Nelly Rout, MD this writer has sought placement for this pt at facilities outside of the [facility] system.  The following facilities have been contacted to seek placement for this pt, with results as noted:   Beds available, information sent, decision pending: [facility]   At capacity: [facility]     Doylene Canning, [place] Behavioral Health Coordinator [phone]

## 2021-12-09 NOTE — BH Assessment (Signed)
Clinician messaged Ninfa Linden, RN: "Hey. It's Trey with TTS. Is the pt able to be assessed. If so can you fax me the IVC paperwork ([phone])?"   Clinician awaiting response.    Redmond Pulling, MS, [facility], [facility] Triage Specialist [phone]

## 2021-12-10 ENCOUNTER — Telehealth: Payer: Self-pay | Admitting: Neurology

## 2021-12-10 DIAGNOSIS — R9431 Abnormal electrocardiogram [ECG] [EKG]: Secondary | ICD-10-CM | POA: Diagnosis not present

## 2021-12-10 LAB — COMPREHENSIVE METABOLIC PANEL
ALT: 21 U/L (ref 0–44)
AST: 26 U/L (ref 15–41)
Albumin: 4.3 g/dL (ref 3.5–5.0)
Alkaline Phosphatase: 63 U/L (ref 38–126)
Anion gap: 8 (ref 5–15)
BUN: 13 mg/dL (ref 8–23)
CO2: 24 mmol/L (ref 22–32)
Calcium: 9.4 mg/dL (ref 8.9–10.3)
Chloride: 109 mmol/L (ref 98–111)
Creatinine, Ser: 0.74 mg/dL (ref 0.44–1.00)
GFR, Estimated: >60 mL/min (ref >60)
Glucose, Bld: 83 mg/dL (ref 70–99)
Potassium: 4 mmol/L (ref 3.5–5.1)
Sodium: 141 mmol/L (ref 135–145)
Total Bilirubin: 0.5 mg/dL (ref 0.3–1.2)
Total Protein: 6.9 g/dL (ref 6.5–8.1)

## 2021-12-10 MED ORDER — WHITE PETROLATUM EX OINT
TOPICAL_OINTMENT | CUTANEOUS | Status: DC | PRN
  Filled 2021-12-10: qty 5

## 2021-12-10 NOTE — Telephone Encounter (Signed)
I received an after-hours call message from patient's daughter, Tami Lewis stating that patient had been at [facility] triage since Thursday, caller had questions. I attempted to call back the number given, but got a generic VM, did not state a name. I did not leave a message. Upon chart review, patient is in the ER and currently treated for cystitis with Macrobid and for agitation with zyprexa and hydroxyzine.  Her outpt meds include Xanax and Lamictal, and donepezil, which she also received.  Per ER note, plan is for geriatric-psych placement, not involuntarily committed.  Dr. Epimenio Foot also had spoken to Tami on 12/08/21, I reviewed the ph note.

## 2021-12-10 NOTE — Progress Notes (Signed)
CSW followed up with Leeroy Bock who advised that she received Medical Clearance documentation and is awaiting a copy of the IVC paperwork and updated potassium levels.   Crissie Reese, MSW, LCSW-A, LCAS-A Phone: [phone] Disposition/TOC

## 2021-12-10 NOTE — Progress Notes (Signed)
CSW spoke with Malachi Bonds with [facility] in reference to a referral sent for possible placement. It was reported by Malachi Bonds that the referral will be review and she will give CSW a call back.   Crissie Reese, MSW, LCSW-A, LCAS-A Phone: [phone] Disposition/TOC

## 2021-12-10 NOTE — Progress Notes (Signed)
CSW was contacted by the patient's nurse to reach out to the family in reference to placement options for the patient. CSW spoke with April, daughter, of the patient and advised that the medical POA, husband, can contact the nurse of the patient for further about placement. CSW informed April of facilities in the area that have [facility] beds and discussed the IVC process with her.   Crissie Reese, MSW, LCSW-A, LCAS-A Phone: [phone] Disposition/TOC

## 2021-12-10 NOTE — Progress Notes (Signed)
CSW followed-up with [facility] from [place] in reference to a referral sent to be reviewed for possible placement. It was reported that a notes stating the patient is medically cleared is needed for further review. CSW faxed provider's notes to be reviewed by the facility.   Crissie Reese, MSW, LCSW-A, LCAS-A Phone: [phone] Disposition/TOC

## 2021-12-10 NOTE — ED Notes (Signed)
Daughter at bedside with patient. Requesting update from provider. Brooke NP aware. She states patient's husband is POA. April (daughter) [phone], Lynnell Dike (daughter) [phone]

## 2021-12-10 NOTE — ED Provider Notes (Signed)
Emergency Medicine Observation Re-evaluation Note  Tami Lewis is a 80 y.o. female, seen on rounds today.  Pt initially presented to the ED for complaints of Altered Mental Status (Patient is being IVC'd) Currently, the patient is resting comfortably.  Physical Exam  BP 103/83    Pulse 87    Temp 97.8 F (36.6 C) (Oral)    Resp 16    Ht 5\' 4"  (1.626 m)    Wt 64 kg    SpO2 98%    BMI 24.22 kg/m  Physical Exam General: NAD   ED Course / MDM  EKG:EKG Interpretation  Date/Time:  Friday December 09 2021 08:13:36 EST Ventricular Rate:  70 PR Interval:  187 QRS Duration: 106 QT Interval:  407 QTC Calculation: 440 R Axis:   -66 Text Interpretation: Sinus rhythm LAD, consider left anterior fascicular block Anterior infarct, age indeterminate No previous ECGs available Confirmed by 11[birth year]1985 [phone]) on 12/09/2021 11:31:14 PM  I have reviewed the labs performed to date as well as medications administered while in observation.  Recent changes in the last 24 hours include agitation -  medically managed.  Plan  Current plan is for geri-psych placement.  Tami Lewis is not under involuntary commitment.     Sharyn Lull, MD 12/10/21 1320

## 2021-12-10 NOTE — Progress Notes (Signed)
Per Tamela Oddi, patient meets criteria for inpatient treatment. There are no available or appropriate beds at [facility] today. It should be noted that Mannie Stabile advised that they currently have no available beds, however will be reviewing Geri bed tomorrow. CSW faxed referrals to the following facilities for review:  [facility]  Pending - Request Sent N/A [address], [place] [address] [phone] --  [facility]  Pending - Request Sent N/A [address] [place], [place] [address] [phone] --  [facility]  Pending - Request Sent N/A [address] [place] [address] [phone] --  [facility]  Pending - Request Sent N/A [address] [place] [address] [phone] --  [facility]  Pending - No Request Sent N/A [address], [place] [address] [phone] --  [facility]  Pending - No Request Sent N/A [address] [place] [address] [phone] --  [facility]  Pending - No Request Sent N/A [address] [place] [address] [phone] --  [facility]  Pending - No Request Sent N/A [address], [place] [address] [phone] --  [facility]  Pending - No Request Sent N/A [address] [place] [address] [phone] --  [facility]  Pending - No Request Sent N/A [address] [place] [address] [phone] --   TTS will continue to seek bed placement.  Crissie Reese, MSW, LCSW-A, LCAS-A Phone: [phone] Disposition/TOC

## 2021-12-11 DIAGNOSIS — F03918 Unspecified dementia, unspecified severity, with other behavioral disturbance: Secondary | ICD-10-CM | POA: Diagnosis not present

## 2021-12-11 DIAGNOSIS — F02818 Dementia in other diseases classified elsewhere, unspecified severity, with other behavioral disturbance: Secondary | ICD-10-CM

## 2021-12-11 DIAGNOSIS — R9431 Abnormal electrocardiogram [ECG] [EKG]: Secondary | ICD-10-CM | POA: Diagnosis not present

## 2021-12-11 DIAGNOSIS — F0390 Unspecified dementia without behavioral disturbance: Secondary | ICD-10-CM | POA: Diagnosis present

## 2021-12-11 MED ORDER — NITROFURANTOIN MONOHYD MACRO 100 MG PO CAPS: 100.0000 mg | ORAL_CAPSULE | Freq: Two times a day (BID) | ORAL | 0 refills | Status: DC

## 2021-12-11 MED ORDER — HYDROXYZINE HCL 10 MG PO TABS: 10.0000 mg | ORAL_TABLET | Freq: Three times a day (TID) | ORAL | 0 refills | Status: DC | PRN

## 2021-12-11 MED ORDER — OLANZAPINE 5 MG PO TABS: 5.0000 mg | ORAL_TABLET | Freq: Every day | ORAL | 0 refills | Status: DC

## 2021-12-11 MED ORDER — FLEET ENEMA 7-19 GM/118ML RE ENEM
1.0000 | ENEMA | Freq: Once | RECTAL | Status: AC
  Administered 2021-12-11: 19:00:00 1 via RECTAL
  Filled 2021-12-11: qty 1

## 2021-12-11 NOTE — Discharge Instructions (Addendum)
We have added Zyprexa every night.  You can take hydroxyzine 3 times daily as needed for anxiety.  You may feel tired or be sedated after taking these meds  Please follow-up with behavioral health or with your psychiatrist or neurologist  Return to ER if you have agitation, lethargy, abdominal pain, vomiting   [facility]-will provide timely access to mental health services for children and adolescents (4-17) and adults presenting in a mental health crisis. The program is designed for those who need urgent Behavioral Health or Substance Use treatment and are not experiencing a medical crisis that would typically require an emergency room visit.    [address] [place], [place] [address] Phone: [phone] [email or web address]   [facility] will also offer the following outpatient services: (Monday through Friday 8am-5pm)   Partial Hospitalization Program (PHP) Substance Abuse Intensive Outpatient Program (SA-IOP) Group Therapy Medication Management Peer Living Room   We also provide (24/7):    Assessments: Our mental health clinician and providers will conduct a focused mental health evaluation, assessing for immediate safety concerns and further mental health needs.   Referral: Our team will provide resources and help connect to community based mental health treatment, when indicated, including psychotherapy, psychiatry, and other specialized behavioral health or substance use disorder services (for those not already in treatment).   Transitional Care: Our team providers in person bridging and/or telphonic follow-up during the patient's transition to outpatient services.        Resources   [facility] Address: [address], [place], [place] [address] Phone: [phone]  [facility] Address: [address] [place], [place] [address] Phone: [phone]  [facility] Address: [address], [place], [place] [address] Phone: [phone]  [facility] - [facility] Address: [address], [place], [place] [address] Phone: [phone]  Other Resources:   Adult Care Licensure [phone] Responsible for monitoring Adult Care Homes to ensure licensure compliance, investigation of licensure related complaints. Assists community toward licensure of new adult care homes. General Adult Services (Short Term) [phone] Service provides short-term assistance for adults needing help accessing services in order to meet their essential needs. Eligibility varies based upon disability, needs, and programmatic criteria. Adult Day Care/Health Services [phone] Provides assistance to aged/disabled adults to remain living independently, reduce social isolation, loneliness, and stimulate interest in leisure activities. The program is designated by the state to monitor certification compliance of adult day centers in [place]. Adult Placement [phone] Provides case management services to community residents seeking placement into either a nursing home or adult care home when they are unable to remain in their current living situations. Guardianship [phone] Social Workers provide comprehensive ongoing case management services to adults that have been adjudicated incompetent by the [employer] and for whom the Production assistant, radio has been appointed legal guardian.

## 2021-12-11 NOTE — Progress Notes (Addendum)
CSW provided the following resources to be utilize upon discharge:   [facility] provide timely access to mental health services for children and adolescents (4-17) and adults presenting in a mental health crisis. The program is designed for those who need urgent Behavioral Health or Substance Use treatment and are not experiencing a medical crisis that would typically require an emergency room visit.    [address] [place], [place] [address] Phone: [phone] [email or web address]   [facility] will also offer the following outpatient services: (Monday through Friday 8am-5pm)   Partial Hospitalization Program (PHP) Substance Abuse Intensive Outpatient Program (SA-IOP) Group Therapy Medication Management Peer Living Room   We also provide (24/7):    Assessments: Our mental health clinician and providers will conduct a focused mental health evaluation, assessing for immediate safety concerns and further mental health needs.   Referral: Our team will provide resources and help connect to community based mental health treatment, when indicated, including psychotherapy, psychiatry, and other specialized behavioral health or substance use disorder services (for those not already in treatment).   Transitional Care: Our team providers in person bridging and/or telphonic follow-up during the patient's transition to outpatient services.        Resources   [facility] Address: [address], [place], [place] [address] Phone: [phone]  [facility] at [facility] Address: [address] [place], [place] [address] Phone: [phone]  [facility] Address: [address], [place], [place] [address] Phone: [phone]  [facility] - [facility] Address: [address], [place], [place] [address] Phone: [phone]  Adult Care  Licensure [phone] Responsible for monitoring Adult Care Homes to ensure licensure compliance, investigation of licensure related complaints. Assists community toward licensure of new adult care homes. General Adult Services (Short Term) [phone] Service provides short-term assistance for adults needing help accessing services in order to meet their essential needs. Eligibility varies based upon disability, needs, and programmatic criteria. Adult Day Care/Health Services [phone] Provides assistance to aged/disabled adults to remain living independently, reduce social isolation, loneliness, and stimulate interest in leisure activities. The program is designated by the state to monitor certification compliance of adult day centers in [place]. Adult Placement [phone] Provides case management services to community residents seeking placement into either a nursing home or adult care home when they are unable to remain in their current living situations. Guardianship [phone] Social Workers provide comprehensive ongoing case management services to adults that have been adjudicated incompetent by the [employer] and for whom the Production assistant, radio has been appointed legal guardian.  Crissie Reese, MSW, LCSW-A, LCAS-A Phone: [phone] Disposition/TOC

## 2021-12-11 NOTE — Progress Notes (Signed)
Per Brandi,Admissions, pt has been accepted to [facility] unit. Accepting provider is Dr. Carola Rhine. Patient can arrive by anytime. Number for report is [phone].  Crissie Reese, MSW, LCSW-A Phone: [phone] Disposition/TOC

## 2021-12-11 NOTE — Consult Note (Signed)
Telepsych Consultation   Reason for Consult:  geri-psych Referring Physician:  Kennis Carina, MD Location of Patient: [facility] Location of Provider: Behavioral Health TTS Department  Patient Identification: ZAIDEE RION MRN:  [id number] Principal Diagnosis: Dementia, Alzheimer's, with behavior disturbance (HCC) Diagnosis:  Principal Problem:   Dementia, Alzheimer's, with behavior disturbance (HCC) Active Problems:   Dementia (HCC)   Total Time spent with patient: 20 minutes  Subjective:   LAURE LEONE is a 80 y.o. female patient admitted via IVC with dementia and other behavioral disturbances. Upon assessment patient was referred for inpatient gero-psychiatric placement; 12/11/21 patient was accepted to [facility] unit. Family notified of placement; family declined placement (daughters) asked that patient be reassessed to determine if inpatient placement appropriate.   [id number]- Patient presented in hallway with daughter for assessment due to inability to find separate room. Patient presents pleasant; alert and oriented to person, place. Partial to time. Euthymic mood; calm and cooperative. Patient denies any suicidal or homicidal ideations, visual or auditory hallucinations; describes seeing "patterns when I stare at things for a while". Assessment stopped due to inability to provide privacy. Charge RN notified of need to move patient. Charge RN [place] notified provider there were no available rooms at this time. Will resume when available.   1632-assessment resumed:  Patient alert and oriented to person, place, partial to time and situation. Calm and cooperative; x2 daughters at bedside.   Patient successfully completed serial 2's after two attempts, interpret proverbs, follow 3 part direction, name common objects, and repeat language. Unable to complete 3 word recall, spell world backwards, tell specific date. She denies any suicidal or homicidal ideations, auditory or visual  hallucinations; she appears calm and cooperative with no obvious responding to external/internal stimuli. She states her religious faith and love of her family as protective factors. She does endorse a history of cataracts and reflex sympathetic dystrophy syndrome; states she sees patterns after staring at objects for a while and has "songs in her head" that she relates to her history and love of music. Denies seeing any patterns or hearing any songs at the time of assessment. Patient became circumstantial throughout assessment.   Collateral:  Dayja Loveridge (husband) [phone] States patient lives at home with him and "ran away twice this week; her mental health has not been good. In a little less than a month, I don't know if its her medications or what. She'll put things on the stove and leave them there. Very wishy washy. Last week she just went bananas and said I took the check book. She was packing trying to run away, accused me of taking credit cards. She wants to drive but she's unable to then gets mad and said  that if she got a hold of the car she would run it in a tree. She ran out of the house and locked herself in a neighbor's bathroom, it took the police to come get her".   Provider discussed patient's current presentation, UTI diagnosis treatment and it's possible relation to her recent behavioral changes. States he is "visiting the thought of possible out of home placement" and interested in resources. Provided verbal permission to speak to daughter April who he states is beside him.   April Lawrence (daughter) States mom's memory has gotten worse. "In the past 3 months since Labor Day she has had several these episodes". Discussed decline in memory with increased "mental health"/behaviors. Says mom has been managing medications at home and refuses help (has been  on Xanax x25 years). Over past two weeks patient has become more acute with behaviors and confusion. Says mom walked out of the  house in the rain and walked to the fire department this week; family concerned about change in acuity.     Provider discussed at length inpatient hospitalization criteria, urinary tract infection effects, dementia, current medication regimen, available outpatient resources (neuropsychology) and long-term care. States mother has a sister with Alzheimer's on memory care unit in [place]; verbalized an understanding that disease may be progressing. Feels mother has improved since being in ED. Asked provider if able to write letter regarding competency; provider declined, further explained legal implications of competency and directed her to follow up with magistrate and county court to request competency hearing. Daughter verbalized an understanding that patient does not meet inpatient criteria, is currently receiving treatment for UTI that may have contributed to patient's behavioral changes; she in turn requested home health resources while family explores long term options including possible out of home placement if patient is organically declining.   Per chart review:  12/10/21 1345 ([facility]): I received an after-hours call message from patient's daughter, April Lawrence stating that patient had been at [facility] triage since Thursday, caller had questions. I attempted to call back the number given, but got a generic VM, did not state a name. I did not leave a message. Upon chart review, patient is in the ER and currently treated for cystitis with Macrobid and for agitation with zyprexa and hydroxyzine.  Her outpt meds include Xanax and Lamictal, and donepezil, which she also received. Per ER note, plan is for geriatric-psych placement, not involuntarily committed. Dr. Epimenio Foot also had spoken to April on 12/08/21, I reviewed the ph note.  12/08/21 1551 ([facility]): "I spoke to her daughter April.  Ms. Blacksher has had more paranoia towards her family and packed a bag and left  the house earlier today.  She had told someone that she was going to the fire department nearby and that is where she was.Her daughter now has her safely back home.  Additionally, Ms. Romack's son is also in town. She has a mild dementia but has had behavioral problems and more recent paranoia worsening over the past few months. I most concerned about the possibility of frontotemporal dementia.  Her presentation would be less typical for classic Alzheimer's disease. I had previously written for lamotrigine in the hope that it would help both her dysesthesias as well as help as a mood stabilizer.  However, it does not seem to be helping her paranoia.  I have also written for as needed hydroxyzine but she has not taken any and she is suspicious about taking any medication. I will send in a prescription for olanzapine 5 mg to take 1 daily as a daily medication might be more effective than an as needed medication. I also discussed that if she worsens further the family may want to take her to the behavioral health urgent care ([address]  HPI:  Norris Bodley. Foronda 80 year old with past history of anxiety, dementia, and dementia with behavioral disturbances who presented to [facility] via IVC by her husband for anger outbursts, paranoid delusions, and leaving the home. Upon admission ED, patient was recommended for inpatient psychiatric hospitalization, began psychotropic medications per outpatient provider recommendation, and antibiotics after being found to have UTI/cystitis. UDS+benzodiazepines, BAL<10; PDMP reviewed 11/24/21 Alprazolam  30 day filled.   Past Psychiatric History: anxiety, depression, dementia with behavioral disturbances  Risk to Self:   Risk to Others:   Prior Inpatient Therapy:   Prior Outpatient Therapy:    Past Medical History:  Past Medical History:  Diagnosis Date   Hyperthyroidism    Reflex sympathetic dystrophy    Vision abnormalities     Past Surgical History:  Procedure  Laterality Date   TONSILLECTOMY     Family History:  Family History  Problem Relation Age of Onset   Stroke Father    Dementia Paternal Aunt    Family Psychiatric  History: not noted Social History:  Social History   Substance and Sexual Activity  Alcohol Use No   Comment: 1-2 small glasses of wine per month     Social History   Substance and Sexual Activity  Drug Use No    Social History   Socioeconomic History   Marital status: Married    Spouse name: Not on file   Number of children: Not on file   Years of education: Not on file   Highest education level: Not on file  Occupational History   Not on file  Tobacco Use   Smoking status: Never   Smokeless tobacco: Never  Substance and Sexual Activity   Alcohol use: No    Comment: 1-2 small glasses of wine per month   Drug use: No   Sexual activity: Not on file  Other Topics Concern   Not on file  Social History Narrative   Lives with husband   Right handed   Caffeine: 1 cup in the AM   Social Determinants of Health   Financial Resource Strain: Not on file  Food Insecurity: Not on file  Transportation Needs: Not on file  Physical Activity: Not on file  Stress: Not on file  Social Connections: Not on file   Additional Social History:    Allergies:   Allergies  Allergen Reactions   Tetracyclines & Related Other (See Comments)    Possibly caused c-diff     Labs:  Results for orders placed or performed during the hospital encounter of 12/09/21 (from the past 48 hour(s))  Comprehensive metabolic panel     Status: None   Collection Time: 12/10/21  3:50 PM  Result Value Ref Range   Sodium 141 135 - 145 mmol/L   Potassium 4.0 3.5 - 5.1 mmol/L    Comment: DELTA CHECK NOTED SLIGHT HEMOLYSIS    Chloride 109 98 - 111 mmol/L   CO2 24 22 - 32 mmol/L   Glucose, Bld 83 70 - 99 mg/dL    Comment: Glucose reference range applies only to samples taken after fasting for at least 8 hours.   BUN 13 8 - 23 mg/dL    Creatinine, Ser 6.04 0.44 - 1.00 mg/dL   Calcium 9.4 8.9 - 54.0 mg/dL   Total Protein 6.9 6.5 - 8.1 g/dL   Albumin 4.3 3.5 - 5.0 g/dL   AST 26 15 - 41 U/L   ALT 21 0 - 44 U/L   Alkaline Phosphatase 63 38 - 126 U/L   Total Bilirubin 0.5 0.3 - 1.2 mg/dL   GFR, Estimated >98 >11 mL/min    Comment: (NOTE) Calculated using the CKD-EPI Creatinine Equation (2021)    Anion gap 8 5 - 15    Comment: Performed at [facility], [address], [place], [place] [address]    Medications:  Current Facility-Administered Medications  Medication Dose Route Frequency Provider Last Rate Last Admin   ALPRAZolam Prudy Feeler) tablet 0.5 mg  0.5 mg Oral q1600 Tanda Rockers A, DO   0.5 mg at 12/11/21 1610   And   ALPRAZolam Prudy Feeler) tablet 2 mg  2 mg Oral QHS Tanda Rockers A, DO   2 mg at 12/10/21 2220   donepezil (ARICEPT) tablet 5 mg  5 mg Oral QHS Maryagnes Amos, FNP   5 mg at 12/10/21 2220   hydrOXYzine (ATARAX) tablet 10 mg  10 mg Oral TID Maryagnes Amos, FNP   10 mg at 12/11/21 1808   lamoTRIgine (LAMICTAL) tablet 75 mg  75 mg Oral BID Tanda Rockers A, DO   75 mg at 12/11/21 9604   And   lamoTRIgine (LAMICTAL) tablet 50 mg  50 mg Oral q1600 Tanda Rockers A, DO   50 mg at 12/11/21 1809   lidocaine (LIDODERM) 5 % 1 patch  1 patch Transdermal Daily PRN Sloan Leiter, DO       nitrofurantoin (macrocrystal-monohydrate) (MACROBID) capsule 100 mg  100 mg Oral BID Tilden Fossa, MD   100 mg at 12/11/21 0928   OLANZapine zydis (ZYPREXA) disintegrating tablet 2.5 mg  2.5 mg Oral BID Maryagnes Amos, FNP   2.5 mg at 12/11/21 5409   polyethylene glycol (MIRALAX / GLYCOLAX) packet 17 g  17 g Oral Daily PRN Tanda Rockers A, DO   17 g at 12/11/21 1347   white petrolatum (VASELINE) gel   Topical PRN Lorre Nick, MD       Current Outpatient Medications  Medication Sig Dispense Refill   Alpha-Lipoic Acid 600 MG TABS Take 600 mg by mouth 2 (two) times daily.     ALPRAZolam  (XANAX) 1 MG tablet Take 0.5-2 mg by mouth See admin instructions. Take 1/2 tablet (0.5 mg) by mouth daily at 4pm, take 2 tablets (2 mg) at bedtime     Ascorbic Acid (VITAMIN C) 1000 MG tablet Take 1,000 mg by mouth daily.     Ashwagandha 500 MG CAPS Take 500-1,000 mg by mouth daily.     B Complex Vitamins (B COMPLEX 50) TABS Take 1 tablet by mouth daily.     Cholecalciferol (VITAMIN D3) 50 MCG (2000 UT) TABS Take 2,000 Units by mouth daily.     donepezil (ARICEPT) 5 MG tablet Take 1 tablet (5 mg total) by mouth at bedtime. 30 tablet 11   Homeopathic Products (ARNICARE) GEL Apply 1 application topically daily as needed (pain).     Ibuprofen-Acetaminophen 125-250 MG TABS Take 1-2 tablets by mouth daily as needed (pain/headache).     lamoTRIgine (LAMICTAL) 25 MG tablet Take 3 tablet (75 mg) in the morning, 2 tablets (50 mg) around 3 pm, and 3 tablets (75 mg) at bedtime. (Patient taking differently: Take 50-75 mg by mouth See admin instructions. Take 3 tablets (75 mg) by mouth in the morning, 2 tablets (50 mg) at 4pm, and 3 tablets (75 mg) at bedtime.) 240 tablet 5   LIDOCAINE EX Apply 1 application topically daily as needed (pain).     Lutein 20 MG TABS Take 20 mg by mouth daily.     MAGNESIUM CITRATE PO Take 368 mg by mouth daily. 184 mg X 2 tablets     Omega 3 1000 MG CAPS Take 1,000 mg by mouth daily.     OVER THE COUNTER MEDICATION Take 1-2 tablets by mouth at bedtime as needed (sleep). Calms forte     OVER THE COUNTER MEDICATION Take 1-2 tablets by mouth at bedtime as needed (sleep). Best Sleep  polyethylene glycol (MIRALAX / GLYCOLAX) 17 g packet Take 17 g by mouth daily as needed (constipation).     Probiotic Product (PROBIOTIC PO) Take 1 tablet by mouth daily.     Quercetin 500 MG CAPS Take 500 mg by mouth 2 (two) times daily as needed (allergies).     Zinc 20 MG CAPS Take 20 mg by mouth daily.     hydrOXYzine (ATARAX) 10 MG tablet Take 1 tablet (10 mg total) by mouth 3 (three) times  daily as needed for anxiety. 30 tablet 0   OLANZapine (ZYPREXA) 5 MG tablet Take 1 tablet (5 mg total) by mouth at bedtime. 30 tablet 0    Musculoskeletal: Strength & Muscle Tone: within normal limits Gait & Station: normal Patient leans: N/A  Psychiatric Specialty Exam:  Presentation  General Appearance: Appropriate for Environment; Casual  Eye Contact:Fair  Speech:Clear and Coherent  Speech Volume:Normal  Handedness:No data recorded  Mood and Affect  Mood:Euthymic  Affect:Congruent   Thought Process  Thought Processes:Other (comment) (relevant to questions)  Descriptions of Associations:Circumstantial  Orientation:Partial  Thought Content:Logical  History of Schizophrenia/Schizoaffective disorder:No  Duration of Psychotic Symptoms:No data recorded Hallucinations:Hallucinations: None  Ideas of Reference:None  Suicidal Thoughts:Suicidal Thoughts: No  Homicidal Thoughts:Homicidal Thoughts: No   Sensorium  Memory:Immediate Fair; Recent Fair; Remote Fair  Judgment:Poor  Insight:Fair; Shallow   Executive Functions  Concentration:Fair  Attention Span:Fair  Recall:Poor  Fund of Knowledge:Good  Language:Good   Psychomotor Activity  Psychomotor Activity:Psychomotor Activity: Normal   Assets  Assets:Social Support; Resilience; Physical Health; Housing; Intimacy; Financial Resources/Insurance   Sleep  Sleep:Sleep: Fair    Physical Exam: Physical Exam Vitals and nursing note reviewed.  Constitutional:      Appearance: She is normal weight. She is not ill-appearing or toxic-appearing.  HENT:     Head: Normocephalic.     Nose: Nose normal.     Mouth/Throat:     Mouth: Mucous membranes are moist.     Pharynx: Oropharynx is clear.  Eyes:     Pupils: Pupils are equal, round, and reactive to light.  Cardiovascular:     Rate and Rhythm: Normal rate.     Pulses: Normal pulses.  Pulmonary:     Effort: Pulmonary effort is normal.   Musculoskeletal:        General: Normal range of motion.     Cervical back: Normal range of motion.  Skin:    General: Skin is warm.  Neurological:     Mental Status: She is alert. She is disoriented.  Psychiatric:        Attention and Perception: Attention and perception normal.        Mood and Affect: Mood and affect normal.        Speech: Speech normal.        Behavior: Behavior is cooperative.        Thought Content: Thought content is not paranoid or delusional. Thought content does not include homicidal or suicidal ideation. Thought content does not include homicidal or suicidal plan.        Cognition and Memory: Cognition and memory normal.        Judgment: Judgment is impulsive.   Review of Systems  Psychiatric/Behavioral:  Positive for memory loss. Negative for depression, hallucinations, substance abuse and suicidal ideas.   All other systems reviewed and are negative. Blood pressure (!) 131/95, pulse 70, temperature 98 F (36.7 C), temperature source Oral, resp. rate 18, height 5\' 4"  (1.626 m), weight 64 kg, SpO2  97 %. Body mass index is 24.22 kg/m.  Treatment Plan Summary: Plan Patient currently does not meet criteria for inpatient hospitalization. Provider discussed plan of care at length with husband and daughter April. Patient currently has outpatient provider who manages medications ([facility]) and contracts for safety. Daughters deny any concerns that mother is a threat to herself or others and feels safe returning her home through the holidays where they plan to continue current medication regimen, follow up with outpatient provider then assess needs.   Disposition: No evidence of imminent risk to self or others at present.   Patient does not meet criteria for psychiatric inpatient admission. Supportive therapy provided about ongoing stressors. Discussed crisis plan, support from social network, calling 911, coming to the Emergency Department, and  calling Suicide Hotline.  Provider discussed need to maintain medication regimen and follow up with provider to address any changes or concerns. Family provided with resources for local neuropsychology providers, Memory care units, home health agencies by social work. EDP to send prescriptions for Zyprexa, Hydroxyzine to pharmacy of choice.    This service was provided via telemedicine using a 2-way, interactive audio and video technology.  Names of all persons participating in this telemedicine service and their role in this encounter. Name: Maxie Barb Role: PMHNP  Name: Nelly Rout Role: Attendind MD  Name: Shalie Schremp [facility] Role: patient  Name: Davia Smyre Name: April Role: husband     Loletta Parish, NP 12/11/2021 6:49 PM

## 2021-12-11 NOTE — ED Notes (Signed)
Lunch provided.

## 2021-12-11 NOTE — Progress Notes (Signed)
CSW was contacted by patient's nurse, Gaetano Net, RN via secure chat, in reference to the daughter of the patient, "April"/Phone #[phone],requesting to speak with CSW to gain a better understanding of the inpatient placement process. It was explained by nursing to the daughter,Haywood, and was the closes facility with an appropriate Geriatric treatment bed available at this time.   CSW asked via secure chat were authorization of Release of Information forms were in place from the POA or the patient to authorize communication with family members. It was communicated via secure chat by Hollace Hayward, that the patient is alert, oriented, and consent was obtain from the patient. In addition, CSW requested that documentation be confirmed for authorizations of release forms. It was noted via secure chat that the husband who has been noted as the POA is currently with the daughter and would like to speak to CSW. CSW contacted Graylin Shiver who expressed concerns about the distance of the placement for his wife and if there is anything that can be done with his wife being under Involuntary commitment paperwork (IVC). CSW explained process for an inpatient placement. Additionally, CSW provided general information about the IVC process and usual transport from facility to facility in situations a patient is under IVC. Richard expressed that [facility] is too far away for him to travel and that the family would prefer if a closer placement could be arranged. CSW directed Richard and April who were on the phone communicating concerns, that it would be best to reach out to the ED provider ( phone number provided [phone]) or the doctor for further information in regards to their concerns. CSW notified provider of communications.   Crissie Reese, MSW, LCSW-A, LCAS-A Phone: [phone] Disposition/TOC

## 2021-12-11 NOTE — ED Notes (Signed)
Pt ambulated to restroom with assistance. Pt assisted with oral care.

## 2021-12-11 NOTE — ED Notes (Signed)
TTS at bedside. 

## 2021-12-11 NOTE — ED Provider Notes (Signed)
Emergency Medicine Observation Re-evaluation Note  Tami Lewis is a 80 y.o. female, seen on rounds today.  Pt initially presented to the ED for complaints of Altered Mental Status (Patient is being IVC'd) Currently, the patient is sleeping.  Physical Exam  BP 117/65    Pulse (!) 55    Temp 97.6 F (36.4 C) (Oral)    Resp 16    Ht 5\' 4"  (1.626 m)    Wt 64 kg    SpO2 97%    BMI 24.22 kg/m  Physical Exam General: sleeping Cardiac: normal rate Lungs: no increased WOB Psych: calm  ED Course / MDM  EKG:EKG Interpretation  Date/Time:  Friday December 09 2021 08:13:36 EST Ventricular Rate:  70 PR Interval:  187 QRS Duration: 106 QT Interval:  407 QTC Calculation: 440 R Axis:   -66 Text Interpretation: Sinus rhythm LAD, consider left anterior fascicular block Anterior infarct, age indeterminate No previous ECGs available Confirmed by 11-08-1985 [id number]) on 12/09/2021 11:31:14 PM  I have reviewed the labs performed to date as well as medications administered while in observation.  Recent changes in the last 24 hours include none.  Plan  Current plan is for awaiting geri-psych placement.  On Macrobid for UTI.  Tami Lewis is not under involuntary commitment.     Sharyn Lull, MD 12/11/21 [phone]

## 2021-12-11 NOTE — ED Provider Notes (Addendum)
°  Physical Exam  BP (!) 131/95    Pulse 70    Temp 97.6 F (36.4 C) (Oral)    Resp 18    Ht 5\' 4"  (1.626 m)    Wt 64 kg    SpO2 97%    BMI 24.22 kg/m   Physical Exam  ED Course/Procedures   Clinical Course as of 12/11/21 1801  Fri Dec 09, 2021  0712 Geri-psych placement, dementia, behavioral disturbance, IVC'd by family [SG]    Clinical Course User Index [SG] 0713, DO    Procedures  MDM  Care assumed at 3 PM.  Patient has been here last several days for possible [facility] psych placement.  Patient was referred to different facilities but patient has clinical improvement after being started on Zyprexa and hydroxyzine.  Patient continued on her Xanax and Lamictal and donepezil outpatient.  I was told that family wanted to take patient home.  TTS plans to reassess her  6:03 PM I was notified by psych NP [facility]) that she was reassessed and patient can be discharged.  I had a conference with patient and her husband and 2 daughters. They are in agreement that she is clinically improved.  They want to take her home.  She is constipated and requested an enema prior to discharge.  We will continue Zyprexa and hydroxyzine.  Gave strict return precautions.  She was started on Macrobid and has 3 more days I also prescribed Macrobid.      Tia Alert, MD 12/11/21 12/13/21    Julio Sicks, MD 12/11/21 [phone]

## 2021-12-11 NOTE — ED Notes (Signed)
Breakfast provided.

## 2021-12-11 NOTE — Progress Notes (Signed)
CSW followed up with Brandi with [facility]. IT was reported that they are still waiting on a updated UA. CSW faxed UA to the facility for review.   Crissie Reese, MSW, LCSW-A, LCAS-A Phone: [phone] Disposition/TOC

## 2021-12-12 ENCOUNTER — Telehealth: Payer: Self-pay | Admitting: Neurology

## 2021-12-12 LAB — LAMOTRIGINE LEVEL: Lamotrigine Lvl: 10 ug/mL (ref 2.0–20.0)

## 2021-12-12 NOTE — Telephone Encounter (Signed)
I received another message from patient's daughter, April Lawrence, yesterday, 12/11/2021 at 2 PM.  Caller requested information as the patient had been admitted.  I was able to talk to April, she reported that patient had been admitted to the hospital and she was under involuntary commitment.  They were planning to transfer her to a geriatric psychiatric hospital or placement close to [facility].  Patient's daughter reported that she was not able to get any more information, she had one phone call with the social worker and no phone call from the admitting or treating team or Dr.  She wanted to let Dr. Epimenio Foot know that the behavioral urgent care he had referred to does not take patients above age 74.  She also wanted to make sure Dr. Epimenio Foot know that patient was admitted.  I told the daughter that I would make sure Dr. Epimenio Foot was in the know and would review her chart.  Patient's daughter was not in favor of transferring patient so far away.  I advised her to page the social worker again.  In reviewing her chart, I cannot see if patient is currently admitted.

## 2021-12-12 NOTE — Telephone Encounter (Signed)
Per ER notes pt was dc home with family and clinically had improvement after receiving abx.

## 2021-12-15 ENCOUNTER — Telehealth: Payer: Self-pay | Admitting: Neurology

## 2021-12-15 DIAGNOSIS — R451 Restlessness and agitation: Secondary | ICD-10-CM

## 2021-12-15 DIAGNOSIS — R413 Other amnesia: Secondary | ICD-10-CM

## 2021-12-15 DIAGNOSIS — F418 Other specified anxiety disorders: Secondary | ICD-10-CM

## 2021-12-15 NOTE — Telephone Encounter (Signed)
Called daughter back. Phone staff reported she had further questions about Zyprexa/hydroxyzine. Aware for anxiety/agitation. Two nights ago, they gave Zyprexa 5mg  po qhs. later, mother got very tired and they had to hold her up. Felt drunk. Could not move. Concerned about giving this to her again. They will hold this for now until appt on 12/28/21 w/ Dr. 02/25/22 to further discuss. She will continue hydroxyzine prn. She will contact PCP about having UA completed again to make sure UTI cleared up. Aware referral placed to neuropsych and referral coordinators will work on this as soon as possible. She verbalized understanding.

## 2021-12-15 NOTE — Telephone Encounter (Signed)
April called states she called [facility] and she feels as if this is not where her mother needs to go. She did schedule an appointment but just not sure. April requesting a call back.

## 2021-12-15 NOTE — Telephone Encounter (Signed)
Referral has been sent to [facility]), urgent, requested appointment ASAP.

## 2021-12-15 NOTE — Telephone Encounter (Signed)
Called daughter. Spoke w/ April. Mother walked out of the house because she was upset. Family could not find her. They called 911 and they were able to locate her.  She was then committed involuntarily 12/09/21 to ER. Was at ER for three days in the hallway. At that point, pt wanted to go home. However, she was having visual hallucinations (seeing things on the walls-flowers). They did UA at this point, positive/cystitis. Gave her macrobid.  Did toxicology screen, + for benzos. She is hiding items (credit cards, etc...). Thinking they are missing but they are not per daughter. She just hides them and no one can find them.   ER recommend she be sent to [facility] near [place] because they told them she needed geriatric. By Sunday, they had not spoken w/ MD. Family denied transfer to [facility]. They spoke w/ case worker about this. Daughter states they did not get update about pysch evaluation notes. Had to f/u on this and did virtual conference. Told she was cleared by psych. First day back home ok. She then went back to baseline of being paranoid. Last night, her father called daughter. Requested she go over there.. Pt upset about her medication. Took two lamotrigine and one xanax. Daughter was able to give her the rest of the medication but was up until 1am with her. Up at 9am again the next morning. Pt upset, feels like she is getting babysat. Feeling controlled.  Family having to keep her distracted all the time.  Daughter  tried calling [facility] this morning, spoke w/ Alcario Drought. However, mother walked in while she was on the phone and told Alcario Drought she would call back to make appt. Requesting we place urgent referral for them as they did tell her they did not have referral. I placed urgent referral for this.   Pt has appt w/ Dr. Pete Glatter on 12/29/21 to check memory/discuss not driving. I also scheduled appt for pt to see Dr. Epimenio Foot 12/28/21 at 11:30am.   I asked that  daughter watch for reoccurrence of UTI. PCP can recheck to make sure this has not returned. If another episode occurs where she leaves house and unable to find, they should call 911 again.

## 2021-12-15 NOTE — Telephone Encounter (Signed)
Can you please call daughter and see where it is that she wants to be referred to?

## 2021-12-15 NOTE — Telephone Encounter (Signed)
Pt's daughter is in extreme need of help with her mother. Wanting to get her mother in ASAP. Pt daughter requesting a call back.

## 2021-12-16 DIAGNOSIS — R300 Dysuria: Secondary | ICD-10-CM | POA: Diagnosis not present

## 2021-12-28 ENCOUNTER — Encounter: Payer: Self-pay | Admitting: Neurology

## 2021-12-28 ENCOUNTER — Other Ambulatory Visit: Payer: Medicare Other

## 2021-12-28 ENCOUNTER — Ambulatory Visit: Payer: Medicare Other | Admitting: Neurology

## 2021-12-28 VITALS — BP 120/74 | HR 76 | Ht 64.0 in | Wt 139.0 lb

## 2021-12-28 DIAGNOSIS — F418 Other specified anxiety disorders: Secondary | ICD-10-CM

## 2021-12-28 DIAGNOSIS — F03A11 Unspecified dementia, mild, with agitation: Secondary | ICD-10-CM | POA: Diagnosis not present

## 2021-12-28 DIAGNOSIS — R208 Other disturbances of skin sensation: Secondary | ICD-10-CM

## 2021-12-28 DIAGNOSIS — R413 Other amnesia: Secondary | ICD-10-CM | POA: Diagnosis not present

## 2021-12-28 MED ORDER — RISPERIDONE 0.25 MG PO TABS: ORAL_TABLET | ORAL | 2 refills | Status: DC

## 2021-12-28 NOTE — Progress Notes (Signed)
[facility]  PATIENT: Tami Lewis DOB: Nov 14, 1941  REFERRING DOCTOR OR PCP: Tami Laughter, MD SOURCE: Patient, daughter, notes from primary care, imaging and lab reports, imaging studies personally reviewed.  _________________________________   HISTORICAL  CHIEF COMPLAINT:  Chief Complaint  Patient presents with   Follow-up    Rm 2, w daughters Tami Lewis and Tami Lewis. Pt reports doing well. Pt does have a few concerns and updates she'd like to discuss with provider today.     HISTORY OF PRESENT ILLNESS:  Tami Lewis is a 81 year old woman with painful dysesthesias/RSD, and memory loss.     Update 12/28/2021: She feels she is doing about the same.   She is noting a lot of anxiety.   She notes a mild tremor   She is concerned she is having s.e from lamotrigine.       History today comes from Tami Lewis and her 2 daughters.  I spoke with her 1 daughter (Tami Lewis) privately after the visit.  She did a short term in hospital stay.   She had a behavioral assessment over a video.  They suggested that she do the inpatient geriatric psych unit (near [place]) but that ws too far away.     She was found to have UTI.    They felt the UI was making her issues worse.    She also noted a lot of stress in the household.      She tried olanzapine but it made her gait much more unstable.     Her "RSD "pain is still bothering her.  She notes a vaginal burning sensation much of the time.  When pain is worse, she feels it is over her entire body.  After the visit, I spoke with her daughter and doctors were back-and-forth about diagnosis of RSD.  She reports trouble sleeping due to pain.     She is having more pain in her right leg pain and also groin (has a prolapse causing a lot of discomfort.     She fell aout 5 weeks ago and pain worsened.  Gabapentin had been tried in the past but due to constipation.      She is on Xanax for anxiety.    She has a lot of rage/mood swings.   She has had  difficulties with memory, probably unchanged since the last visit..  She has a lot of anxiety and cognitive testing in 2019 was more consistent with the anxiety playing a larger role in her memory loss.  However, symptoms have progressed.  She is very irritable at times and augmentative -.  She became upset during this visit talking abou her issues.     She had neurocognitive testing in 2018 (Dr. Dimas Lewis).  That time, she did not meet criteria for a dementia syndrome though she was found to have a lot of anxiety due to concerns of her somatic complaints and pain.  She was referred to Dr. Donell Lewis but never saw him.   MMSE - Mini Mental State Exam 12/11/2021 08/30/2021  Orientation to time 2 4  Orientation to Place 3 5  Registration 0 3  Attention/ Calculation 1 1  Recall 0 1  Language- name 2 objects 2 2  Language- repeat 1 1  Language- follow 3 step command 3 3  Language- read & follow direction (No Data) 1  Language-read & follow direction-comments unable to complete -  Write a sentence - 1  Copy design - 0  Total score -  22      ADDITIONAL HISTORY: I saw her in 2019 for memory loss and or RSD related pain in the right ankle.  She also was having some agitation and anxiety.  I prescribed lamotrigine to titrate up to 100 mg p.o. twice daily.  She never started the medication..  More recently, in May, she was prescribed gabapentin 100 mg p.o. 3 times daily but feels it causes constipation (difficult for her as she has a prolapsed uterus).  In 2019, she had neuro cognitive testing.  She saw Dr. Dimas ChyleBailar-Heath who diagnosed severe anxiety but no primary dementia.   Detailed testing, results and recommendations were reviewed.  There is a family history of Alzheimer's disease.  She has some insomnia.    In 2020, she was placed on Paxil and her mood they have done little bit better but she stopped because she felt her tinnitus was worse.  Her daughter reports that she had more issues with her  memory starting in 2020.  She became more forgetful.  She would blame others for her mistakes.  She would have episodes of irritability and anger.  She [facility] an [facility] but walked out angry during the exam.  Symptoms progressed a little bit further in 2021.  In March 2022, she was placed on Zoloft but stopped after 2 weeks.  IMAGING: CT scan of the head dated 05/03/2021.  This showed no acute findings.  There was mild atrophy, typical for age.  There was chronic hypodense changes in the right anterior limb of the internal capsule, not present in 2007.   MRI of the brain 03/28/2006 showed minimal chronic microvascular ischemic change.    REVIEW OF SYSTEMS: Constitutional: No fevers, chills, sweats, or change in appetite Eyes: No visual changes, double vision, eye pain Ear, nose and throat: No hearing loss, ear pain, nasal congestion, sore throat Cardiovascular: No chest pain, palpitations Respiratory:  No shortness of breath at rest or with exertion.   No wheezes GastrointestinaI: No nausea, vomiting, diarrhea, abdominal pain, fecal incontinence Genitourinary:  No dysuria, urinary retention or frequency.  No nocturia. Musculoskeletal:  No neck pain, back pain Integumentary: No rash, pruritus, skin lesions Neurological: as above Psychiatric: As above endocrine: No palpitations, diaphoresis, change in appetite, change in weigh or increased thirst Hematologic/Lymphatic:  No anemia, purpura, petechiae. Allergic/Immunologic: No itchy/runny eyes, nasal congestion, recent allergic reactions, rashes  ALLERGIES: Allergies  Allergen Reactions   Tetracyclines & Related Other (See Comments)    Possibly caused c-diff     HOME MEDICATIONS:  Current Outpatient Medications:    ALPRAZolam (XANAX) 1 MG tablet, Take 0.5-2 mg by mouth See admin instructions. Take 1/2 tablet (0.5 mg) by mouth daily at 4pm, take 2 tablets (2 mg) at bedtime, Disp: , Rfl:    Ascorbic Acid  (VITAMIN C) 1000 MG tablet, Take 1,000 mg by mouth daily., Disp: , Rfl:    B Complex Vitamins (B COMPLEX 50) TABS, Take 1 tablet by mouth daily., Disp: , Rfl:    Cholecalciferol (VITAMIN D3) 50 MCG (2000 UT) TABS, Take 2,000 Units by mouth daily., Disp: , Rfl:    donepezil (ARICEPT) 5 MG tablet, Take 1 tablet (5 mg total) by mouth at bedtime., Disp: 30 tablet, Rfl: 11   Homeopathic Products (ARNICARE) GEL, Apply 1 application topically daily as needed (pain)., Disp: , Rfl:    hydrOXYzine (ATARAX) 10 MG tablet, Take 1 tablet (10 mg total) by mouth 3 (three) times daily as needed  for anxiety., Disp: 30 tablet, Rfl: 0   Ibuprofen-Acetaminophen 125-250 MG TABS, Take 1-2 tablets by mouth daily as needed (pain/headache)., Disp: , Rfl:    lamoTRIgine (LAMICTAL) 25 MG tablet, Take 3 tablet (75 mg) in the morning, 2 tablets (50 mg) around 3 pm, and 3 tablets (75 mg) at bedtime. (Patient taking differently: Take 50-75 mg by mouth See admin instructions. Take 3 tablets (75 mg) by mouth in the morning, 2 tablets (50 mg) at 4pm, and 3 tablets (75 mg) at bedtime.), Disp: 240 tablet, Rfl: 5   LIDOCAINE EX, Apply 1 application topically daily as needed (pain)., Disp: , Rfl:    Lutein 20 MG TABS, Take 20 mg by mouth daily., Disp: , Rfl:    MAGNESIUM CITRATE PO, Take 368 mg by mouth daily. 184 mg X 2 tablets, Disp: , Rfl:    Omega 3 1000 MG CAPS, Take 1,000 mg by mouth daily., Disp: , Rfl:    OVER THE COUNTER MEDICATION, Take 1-2 tablets by mouth at bedtime as needed (sleep). Calms forte, Disp: , Rfl:    OVER THE COUNTER MEDICATION, Take 1-2 tablets by mouth at bedtime as needed (sleep). Best Sleep, Disp: , Rfl:    polyethylene glycol (MIRALAX / GLYCOLAX) 17 g packet, Take 17 g by mouth daily as needed (constipation)., Disp: , Rfl:    Probiotic Product (PROBIOTIC PO), Take 1 tablet by mouth daily., Disp: , Rfl:    Quercetin 500 MG CAPS, Take 500 mg by mouth 2 (two) times daily as needed (allergies)., Disp: , Rfl:     risperiDONE (RISPERDAL) 0.25 MG tablet, One po bid prn, Disp: 60 tablet, Rfl: 2   Zinc 20 MG CAPS, Take 20 mg by mouth daily., Disp: , Rfl:   PAST MEDICAL HISTORY: Past Medical History:  Diagnosis Date   Hyperthyroidism    Reflex sympathetic dystrophy    Vision abnormalities     PAST SURGICAL HISTORY: Past Surgical History:  Procedure Laterality Date   TONSILLECTOMY      FAMILY HISTORY: Family History  Problem Relation Age of Onset   Stroke Father    Dementia Paternal Aunt     SOCIAL HISTORY:  Social History   Socioeconomic History   Marital status: Married    Spouse name: Not on file   Number of children: Not on file   Years of education: Not on file   Highest education level: Not on file  Occupational History   Not on file  Tobacco Use   Smoking status: Never   Smokeless tobacco: Never  Substance and Sexual Activity   Alcohol use: No    Comment: 1-2 small glasses of wine per month   Drug use: No   Sexual activity: Not on file  Other Topics Concern   Not on file  Social History Narrative   Lives with husband   Right handed   Caffeine: 1 cup in the AM   Social Determinants of Health   Financial Resource Strain: Not on file  Food Insecurity: Not on file  Transportation Needs: Not on file  Physical Activity: Not on file  Stress: Not on file  Social Connections: Not on file  Intimate Partner Violence: Not on file     PHYSICAL EXAM  Vitals:   12/28/21 1125  BP: 120/74  Pulse: 76  Weight: 139 lb (63 kg)  Height: 5\' 4"  (1.626 m)    Body mass index is 23.86 kg/m.   General: The patient is well-developed and well-nourished and  in no acute distress  HEENT:  Head is [place]/AT.  Sclera are anicteric.  Skin: Extremities are without rash or  edema.  I saw no difference between the left and right ankle region  Neurologic Exam  Mental status: The patient is alert and oriented x 3 at the time of the examination.  She had reduced short-term memory and  reduced focus.  Speech was normal.    Cranial nerves: Extraocular movements are full.   Facial symmetry is present.  .Facial strength is normal.  No obvious hearing deficits are noted.  Motor:  Muscle bulk is normal.   Tone is normal. Strength is  5 / 5 in all 4 extremities.   Sensory: Sensory testing is intact to pinprick, soft touch and vibration sensation in all 4 extremities.  Coordination: Cerebellar testing reveals good finger-nose-finger and heel-to-shin bilaterally.  Gait and station: Station is normal.   Gait is normal. Tandem gait is mildly wide but normal for age.  She can turn in 3 steps.   Romberg is negative.   Reflexes: Deep tendon reflexes are symmetric and normal bilaterally.        ASSESSMENT AND PLAN  Mild dementia with agitation, unspecified dementia type  Memory loss  Depression with anxiety  Dysesthesia   Continue lamotrigine 75 mg - 50 mg - 75 mg to help the dysesthesias..    I believe she has a mild dementia, though I am not certain that this is Alzheimer's.  She also has behavioral issues and frontotemporal dementia needs to be considered.  However, she has had difficulties with anxiety and personality disorder for many years so sorting this out is difficult.  She scored 22/30 last year on the Mini-Mental Status test at the last visit putting her in the mild dementia range.  Brain volume was normal for age on CT scan earlier this year so probably not AD.    Continue donepezil for memory.  It may help her constipation also. Olanzapine was calming but her gait became unsteady.  I will have her try a very low-dose of Risperdal and it can be increased if she gets some benefit.  Advised to take in the late afternoon and at night. [facility].  Her daughter states that this does come from her primary care provider.  She has an appointment tomorrow.  43 minute-minute office visit with the majority of the time  spent face-to-face for history and physical, discussion/counseling and decision-making.  Additional time with record review and documentation.    Nyana Haren A. Epimenio FootSater, MD, [facility],FAAN 12/28/2021, 12:13 PM Certified in Neurology, Clinical Neurophysiology, Sleep Medicine and Neuroimaging  [facility] [address], [address] [place], [place] [address] [phone]

## 2021-12-29 DIAGNOSIS — R413 Other amnesia: Secondary | ICD-10-CM | POA: Diagnosis not present

## 2021-12-29 DIAGNOSIS — G4700 Insomnia, unspecified: Secondary | ICD-10-CM | POA: Diagnosis not present

## 2021-12-29 DIAGNOSIS — N3000 Acute cystitis without hematuria: Secondary | ICD-10-CM | POA: Diagnosis not present

## 2021-12-29 DIAGNOSIS — R208 Other disturbances of skin sensation: Secondary | ICD-10-CM | POA: Diagnosis not present

## 2021-12-29 DIAGNOSIS — G9050 Complex regional pain syndrome I, unspecified: Secondary | ICD-10-CM | POA: Diagnosis not present

## 2022-01-19 ENCOUNTER — Other Ambulatory Visit: Payer: Self-pay | Admitting: Neurology

## 2022-01-26 ENCOUNTER — Telehealth: Payer: Self-pay | Admitting: Neurology

## 2022-01-26 NOTE — Telephone Encounter (Signed)
Called daughter back. Pt has appt w/ Amy on 05/09/22 and Dr. Epimenio Foot on 05/16/22. Reviewed chart. One made on 05/09/22 was made back in November and 05/23 appt made after most recent visit. They would like to cx 05/16 appt. I cancelled. She is proceeding w/ getting mother seen at [facility]. Nothing further needed.

## 2022-01-26 NOTE — Telephone Encounter (Signed)
Pt's daughter is aware of the 2 appointments scheduled in May, she is asking for a call from RN to clarify appointment information

## 2022-02-02 ENCOUNTER — Ambulatory Visit
Admission: RE | Admit: 2022-02-02 | Discharge: 2022-02-02 | Disposition: A | Discharge status: Home / Self Care | Payer: Medicare Other | Source: Ambulatory Visit | Attending: Geriatric Medicine | Admitting: Geriatric Medicine

## 2022-02-02 ENCOUNTER — Other Ambulatory Visit: Payer: Self-pay | Admitting: Geriatric Medicine

## 2022-02-02 ENCOUNTER — Other Ambulatory Visit: Payer: Self-pay

## 2022-02-02 DIAGNOSIS — N6310 Unspecified lump in the right breast, unspecified quadrant: Secondary | ICD-10-CM

## 2022-02-02 DIAGNOSIS — N6312 Unspecified lump in the right breast, upper inner quadrant: Secondary | ICD-10-CM | POA: Diagnosis not present

## 2022-02-02 DIAGNOSIS — N6311 Unspecified lump in the right breast, upper outer quadrant: Secondary | ICD-10-CM | POA: Diagnosis not present

## 2022-02-06 DIAGNOSIS — Z4689 Encounter for fitting and adjustment of other specified devices: Secondary | ICD-10-CM | POA: Diagnosis not present

## 2022-02-06 DIAGNOSIS — N8111 Cystocele, midline: Secondary | ICD-10-CM | POA: Diagnosis not present

## 2022-03-10 DIAGNOSIS — G9050 Complex regional pain syndrome I, unspecified: Secondary | ICD-10-CM | POA: Diagnosis not present

## 2022-03-10 DIAGNOSIS — M797 Fibromyalgia: Secondary | ICD-10-CM | POA: Diagnosis not present

## 2022-03-14 DIAGNOSIS — H524 Presbyopia: Secondary | ICD-10-CM | POA: Diagnosis not present

## 2022-03-14 DIAGNOSIS — H2513 Age-related nuclear cataract, bilateral: Secondary | ICD-10-CM | POA: Diagnosis not present

## 2022-03-14 DIAGNOSIS — H25043 Posterior subcapsular polar age-related cataract, bilateral: Secondary | ICD-10-CM | POA: Diagnosis not present

## 2022-03-14 DIAGNOSIS — H52223 Regular astigmatism, bilateral: Secondary | ICD-10-CM | POA: Diagnosis not present

## 2022-04-01 ENCOUNTER — Other Ambulatory Visit: Payer: Self-pay | Admitting: Neurology

## 2022-04-12 ENCOUNTER — Telehealth: Payer: Self-pay | Admitting: Neurology

## 2022-04-12 NOTE — Telephone Encounter (Signed)
Pt's daughter called wanting to speak to the RN or Provider to discuss pt's medications. Please advise. ?

## 2022-04-12 NOTE — Telephone Encounter (Signed)
Called daughter back. Relayed Dr. Bonnita Hollow recommendation. She will have her try and take risperidal only at 11pm for the next few days to see how that goes. If behavior worsens, she will go back to having her take it at 4pm and 11pm and will call to let us know. ?

## 2022-04-12 NOTE — Telephone Encounter (Addendum)
Called and spoke w/ daughter, April. She is wondering if her mother should continue Risperdal 0.25mg  po bid (4pm, 11pm). Husband does not want to take her off med. Feels it helps.  ? ?Daughter reports that for the past month, her mother has had issues with cold/allergies. Slipped off bed d/t it being hight. Hurt clavicle/feels it got fractured from fall. She did lower box spring. She has been more groggy, not walking as well. Mother is blaming sx on all her medications and wanting to come off all medication.  ? ?Daughter is wondering if Dr. Felecia Shelling would recommend she wean off of Risperdal?  ? ?She states mother refused to go to [facility] center for a geriatric evaluation.  ? ?Next f/u with Dr. Felecia Shelling is 05/16/22.  ? ?She is going to look into changing her PCP as well. April's daughter works at [employer] and is recommending moving her to geriatric clinic for primary care. Dr. Felipa Eth is getting ready to retired.  ?She is going to also contact PCP about checking UA for UTI.  ?

## 2022-04-13 ENCOUNTER — Telehealth: Payer: Self-pay | Admitting: Neurology

## 2022-04-13 NOTE — Telephone Encounter (Signed)
Called the pt back. Advised the patient that it is not likely her side effects are coming from the lamotrigine. She has been on this medication for a lengthy amount of time. There was even a time where she was wanting to take more of this medication. Pt is adamant that she feels this is a cause behind her symptoms. She states that she has had 2 recent falls thinks the Lamictal contributed to this. She demanding to come off the medication and needs to know how to wean off. I advised that since her case has been rather difficult with finding meds that help it would really be best if she comes in for an apt to discuss further. She finally agreed to coming in sooner. I was able to work in a work in slot for 2:30 pm on 04/17/2022. She accepted and was advised to be in by 2 pm.  ?

## 2022-04-13 NOTE — Telephone Encounter (Signed)
Pt has called to report that she wants to wean off of lamoTRIgine (LAMICTAL) 25 MG tablet as a result of RSD- risk of  epilepsy, constipation, mouth souths, gait issues, tremors, has had falls.  Please call pt to discuss ?

## 2022-04-17 ENCOUNTER — Ambulatory Visit: Payer: Medicare Other | Admitting: Neurology

## 2022-04-17 ENCOUNTER — Encounter: Payer: Self-pay | Admitting: Neurology

## 2022-04-17 VITALS — BP 133/77 | HR 97 | Ht 64.0 in | Wt 146.5 lb

## 2022-04-17 DIAGNOSIS — R451 Restlessness and agitation: Secondary | ICD-10-CM | POA: Diagnosis not present

## 2022-04-17 DIAGNOSIS — F03A11 Unspecified dementia, mild, with agitation: Secondary | ICD-10-CM

## 2022-04-17 DIAGNOSIS — R208 Other disturbances of skin sensation: Secondary | ICD-10-CM | POA: Diagnosis not present

## 2022-04-17 DIAGNOSIS — G9050 Complex regional pain syndrome I, unspecified: Secondary | ICD-10-CM

## 2022-04-17 NOTE — Progress Notes (Signed)
? ?[facility] ? ?PATIENT: Tami Lewis ?DOB: 04/20/1941 ? ?REFERRING DOCTOR OR PCP: Merlene LaughterHal Stoneking, MD ?SOURCE: Patient, daughter, notes from primary care, imaging and lab reports, imaging studies personally reviewed. ? ?_________________________________ ? ? ?HISTORICAL ? ?CHIEF COMPLAINT:  ?Chief Complaint  ?Patient presents with  ? Follow-up  ?  Rm 1, w daughter Tami Lewis. Here to discuss titrating off lamotrigine. Pt believes it is causing her to fall.   ? ? ?HISTORY OF PRESENT ILLNESS:  ?Tami Lewis is a 81 year old woman with painful dysesthesias/RSD, and memory loss.    ? ?Update 04/17/2022: ?She reports having 2 falls.   She feels this is because of the lamotrigine.    She feels it was only helping the pain a little bit though in past felt it was doing more.    ? ?She feels she is doing about the same.   She is noting a lot of anxiety.   She notes a mild tremor   She is concerned she is having s.e from lamotrigine.      ? ?History today comes from Tami Lewis and her 2 daughters.  I spoke with her 1 daughter (Tami Lewis) privately after the visit. ? ?Due to behavioral issues earlier this year, she had a short term hospital stay with behavioral assessment over a video.  They suggested that she do the inpatient geriatric psych unit (near [place]) but that ws too far away.     She was found to have UTI at the time and that certainly could have made matters worse.    She also noted a lot of stress in the household.      She tried olanzapine but it made her gait much more unstable.   She has better tolerated low-dose Risperdal ? ?Her "RSD "pain is still bothering her.  Pain is not classic for RSD.  She notes a vaginal burning sensation much of the time.  When pain is worse, she feels it is over her entire body.   She is having more pain in her right leg pain and also groin (has a prolapse causing a lot of discomfort.     She fell aout 5 weeks ago and pain worsened.  Gabapentin had been tried in the  past but due to constipation.    She seemed to have some benefit from the lamotrigine though now she feels it is not helping her and she wants to stop.  We discussed going on a lower dose to see if it is better tolerated ? ?She is on Xanax for anxiety.    She has a lot of rage/mood swings.   She continues to have some difficulty with cognition and scored only 22/30 on Mini-Mental status exam last year.   She had neurocognitive testing in 2018 (Dr. Dimas ChyleBailar-Heath).  At the time, she did not meet criteria for a dementia syndrome though she was found to have a lot of anxiety due to concerns of her somatic complaints and pain.  However, cognitive issues have progressed since 2018. ? ? ? ?  12/11/2021  ?  4:18 PM 08/30/2021  ? 10:10 AM  ?MMSE - Mini Mental State Exam  ?Orientation to time 2 4  ?Orientation to Place 3 5  ?Registration 0 3  ?Attention/ Calculation 1 1  ?Recall 0 1  ?Language- name 2 objects 2 2  ?Language- repeat 1 1  ?Language- follow 3 step command 3 3  ?Language- read & follow direction  1  ?Language-read & follow direction-comments  unable to complete   ?Write a sentence  1  ?Copy design  0  ?Total score  22  ?  ? ? ?ADDITIONAL HISTORY: ?I saw her in 2019 for memory loss and or RSD related pain in the right ankle.  She also was having some agitation and anxiety.  I prescribed lamotrigine to titrate up to 100 mg p.o. twice daily.  She never started the medication..  More recently, in May, she was prescribed gabapentin 100 mg p.o. 3 times daily but feels it causes constipation (difficult for her as she has a prolapsed uterus).  In 2019, she had neuro cognitive testing.  She saw Dr. Dimas Chyle who diagnosed severe anxiety but no primary dementia.   Detailed testing, results and recommendations were reviewed.  There is a family history of Alzheimer's disease.  She has some insomnia.   ? ?In 2020, she was placed on Paxil and her mood they have done little bit better but she stopped because she felt her  tinnitus was worse.  Her daughter reports that she had more issues with her memory starting in 2020.  She became more forgetful.  She would blame others for her mistakes.  She would have episodes of irritability and anger.  She had an evaluation at the [facility] ger[facility] center but walked out angry during the exam.  Symptoms progressed a little bit further in 2021.  In March 2022, she was placed on Zoloft but stopped after 2 weeks. ? ?IMAGING: ?CT scan of the head dated 05/03/2021.  This showed no acute findings.  There was mild atrophy, typical for age.  There was chronic hypodense changes in the right anterior limb of the internal capsule, not present in 2007.   MRI of the brain 03/28/2006 showed minimal chronic microvascular ischemic change. ?  ? ?REVIEW OF SYSTEMS: ?Constitutional: No fevers, chills, sweats, or change in appetite ?Eyes: No visual changes, double vision, eye pain ?Ear, nose and throat: No hearing loss, ear pain, nasal congestion, sore throat ?Cardiovascular: No chest pain, palpitations ?Respiratory:  No shortness of breath at rest or with exertion.   No wheezes ?GastrointestinaI: No nausea, vomiting, diarrhea, abdominal pain, fecal incontinence ?Genitourinary:  No dysuria, urinary retention or frequency.  No nocturia. ?Musculoskeletal:  No neck pain, back pain ?Integumentary: No rash, pruritus, skin lesions ?Neurological: as above ?Psychiatric: As above endocrine: No palpitations, diaphoresis, change in appetite, change in weigh or increased thirst ?Hematologic/Lymphatic:  No anemia, purpura, petechiae. ?Allergic/Immunologic: No itchy/runny eyes, nasal congestion, recent allergic reactions, rashes ? ?ALLERGIES: ?Allergies  ?Allergen Reactions  ? Tetracyclines & Related Other (See Comments)  ?  Possibly caused c-diff ?  ? ? ?HOME MEDICATIONS: ? ?Current Outpatient Medications:  ?  ALPRAZolam (XANAX) 1 MG tablet, Take 0.5-2 mg by mouth See admin instructions. Take 1/2 tablet (0.5 mg) by mouth daily  at 4pm, take 2 tablets (2 mg) at bedtime, Disp: , Rfl:  ?  Ascorbic Acid (VITAMIN C) 1000 MG tablet, Take 1,000 mg by mouth daily., Disp: , Rfl:  ?  B Complex Vitamins (B COMPLEX 50) TABS, Take 1 tablet by mouth daily., Disp: , Rfl:  ?  Cholecalciferol (VITAMIN D3) 50 MCG (2000 UT) TABS, Take 2,000 Units by mouth daily., Disp: , Rfl:  ?  donepezil (ARICEPT) 5 MG tablet, Take 1 tablet (5 mg total) by mouth at bedtime., Disp: 30 tablet, Rfl: 11 ?  Homeopathic Products (ARNICARE) GEL, Apply 1 application topically daily as needed (pain)., Disp: , Rfl:  ?  hydrOXYzine (ATARAX) 10 MG tablet, Take 1 tablet (10 mg total) by mouth 3 (three) times daily as needed for anxiety., Disp: 30 tablet, Rfl: 0 ?  Ibuprofen-Acetaminophen 125-250 MG TABS, Take 1-2 tablets by mouth daily as needed (pain/headache)., Disp: , Rfl:  ?  lamoTRIgine (LAMICTAL) 25 MG tablet, TAKE 3 TABLETS IN THE MORNING, 2 TABS AT 3PM, AND 3 TABLETS AT BEDTIME, Disp: 720 tablet, Rfl: 1 ?  LIDOCAINE EX, Apply 1 application topically daily as needed (pain)., Disp: , Rfl:  ?  Lutein 20 MG TABS, Take 20 mg by mouth daily., Disp: , Rfl:  ?  MAGNESIUM CITRATE PO, Take 368 mg by mouth daily. 184 mg X 2 tablets, Disp: , Rfl:  ?  Omega 3 1000 MG CAPS, Take 1,000 mg by mouth daily., Disp: , Rfl:  ?  OVER THE COUNTER MEDICATION, Take 1-2 tablets by mouth at bedtime as needed (sleep). Calms forte, Disp: , Rfl:  ?  OVER THE COUNTER MEDICATION, Take 1-2 tablets by mouth at bedtime as needed (sleep). Best Sleep, Disp: , Rfl:  ?  polyethylene glycol (MIRALAX / GLYCOLAX) 17 g packet, Take 17 g by mouth daily as needed (constipation)., Disp: , Rfl:  ?  Probiotic Product (PROBIOTIC PO), Take 1 tablet by mouth daily., Disp: , Rfl:  ?  Quercetin 500 MG CAPS, Take 500 mg by mouth 2 (two) times daily as needed (allergies)., Disp: , Rfl:  ?  risperiDONE (RISPERDAL) 0.25 MG tablet, TAKE 1 TABLET BY MOUTH TWICE A DAY AS NEEDED **D/C OLANZAPINE**, Disp: 180 tablet, Rfl: 1 ?  Zinc 20 MG  CAPS, Take 20 mg by mouth daily., Disp: , Rfl:  ? ?PAST MEDICAL HISTORY: ?Past Medical History:  ?Diagnosis Date  ? Hyperthyroidism   ? Reflex sympathetic dystrophy   ? Vision abnormalities   ? ? ?PAST SURGICA

## 2022-04-21 DIAGNOSIS — R300 Dysuria: Secondary | ICD-10-CM | POA: Diagnosis not present

## 2022-04-21 DIAGNOSIS — N3001 Acute cystitis with hematuria: Secondary | ICD-10-CM | POA: Diagnosis not present

## 2022-04-25 ENCOUNTER — Telehealth: Payer: Self-pay | Admitting: Neurology

## 2022-04-25 NOTE — Telephone Encounter (Signed)
Called the daughter back and she was not here for the apt on 4/24. Previously she had called on 4/19 and talked about lowering the dose of the risperidone. Per the ov Dr Epimenio Foot states to continue that twice a day 4 p and 11 p.  ?She had never tried reducing to the 11 p only dose. She is asking since titrating to lower dose of the lamotrigine should the risperdal should she stay at the 2 doses. I reiterated that he mentioned keeping it for 2 doses at the last visit. She agreed with this plan.  ?

## 2022-04-25 NOTE — Telephone Encounter (Signed)
Pt daughter (on [place]) would like call back concerning recap of pt appt 04/17/2022.  ?[phone] ?

## 2022-05-09 ENCOUNTER — Ambulatory Visit: Payer: Medicare Other | Admitting: Family Medicine

## 2022-05-16 ENCOUNTER — Ambulatory Visit: Payer: Medicare Other | Admitting: Neurology

## 2022-06-06 DIAGNOSIS — Z4689 Encounter for fitting and adjustment of other specified devices: Secondary | ICD-10-CM | POA: Diagnosis not present

## 2022-07-19 ENCOUNTER — Other Ambulatory Visit: Payer: Self-pay | Admitting: Neurology

## 2022-08-17 ENCOUNTER — Encounter: Payer: Self-pay | Admitting: Neurology

## 2022-08-17 ENCOUNTER — Ambulatory Visit: Payer: Medicare Other | Admitting: Neurology

## 2022-08-17 VITALS — BP 133/81 | HR 81 | Ht 64.0 in | Wt 148.5 lb

## 2022-08-17 DIAGNOSIS — F418 Other specified anxiety disorders: Secondary | ICD-10-CM

## 2022-08-17 DIAGNOSIS — R208 Other disturbances of skin sensation: Secondary | ICD-10-CM

## 2022-08-17 DIAGNOSIS — R413 Other amnesia: Secondary | ICD-10-CM | POA: Diagnosis not present

## 2022-08-17 MED ORDER — TRAMADOL HCL 50 MG PO TABS: ORAL_TABLET | ORAL | 1 refills | Status: AC

## 2022-08-17 NOTE — Progress Notes (Signed)
[facility]  PATIENT: Tami Lewis DOB: 06/19/1941  REFERRING DOCTOR OR PCP: Merlene Laughter, MD SOURCE: Patient, daughter, notes from primary care, imaging and lab reports, imaging studies personally reviewed.  _________________________________   HISTORICAL  CHIEF COMPLAINT:  Chief Complaint  Patient presents with   Follow-up    Rm 2, w daughter April. Here to f/u for dementia. Pt has been doing well with reduction of Lamictal. Still has to take prune. MMSE:27    HISTORY OF PRESENT ILLNESS:  Tami Lewis is a 81 year old woman with painful dysesthesias/RSD, and memory loss.     Update 8/24/023: She has had issues with he memory but this is not any worse than the year.  She actually did much better on the mini mental status exam scoring a normal 27/30 today.  She had had trouble completing the MMSE will be checked in December 2022 due to extreme anxiety and scored 22/30 the first time I saw her.  I discussed with her and her daughter that her performance today is good and most likely means that the anxiety was playing a very large role in her memory issues.  Her "RSD "pain is still bothering her.  Pain is not classic for RSD.  She notes a vaginal burning sensation much of the time.  When pain is worse, she feels it is over her entire body.   She is having more pain in her right leg pain and also groin (has a prolapse causing a lot of discomfort.    She has seen gynecology for this but they have not found anything to target.    Gabapentin had been tried in the past but due to constipation she stopped.   She is taking lamotrigine 25 mg po qid and feels she could not go up on the dose.   She reports sleeping is worse due to the pain.    She has some urinary hesitancy at times.  She is on Xanax for anxiety.    She has a lot of rage/mood swings.   She continues to have some difficulty with cognition and scored only 22/30 on Mini-Mental status exam last year.   She had  neurocognitive testing in 2018 (Dr. Dimas Chyle).  At the time, she did not meet criteria for a dementia syndrome though she was found to have a lot of anxiety due to concerns of her somatic complaints and pain.       08/17/2022    3:03 PM 12/11/2021    4:18 PM 08/30/2021   10:10 AM  MMSE - Mini Mental State Exam  Orientation to time 4 2 4   Orientation to Place 5 3 5   Registration 3 0 3  Attention/ Calculation 4 1 1   Recall 2 0 1  Language- name 2 objects 2 2 2   Language- repeat 1 1 1   Language- follow 3 step command 3 3 3   Language- read & follow direction 1  1  Language-read & follow direction-comments  unable to complete   Write a sentence 1  1  Copy design 1  0  Total score 27  22      ADDITIONAL HISTORY: I saw her in 2019 for memory loss and or RSD related pain in the right ankle.  She also was having some agitation and anxiety.  I prescribed lamotrigine to titrate up to 100 mg p.o. twice daily.  She never started the medication..  More recently, in May, she was prescribed gabapentin 100 mg p.o. 3 times  daily but feels it causes constipation (difficult for her as she has a prolapsed uterus).  In 2019, she had neuro cognitive testing.  She saw Dr. Dimas Chyle who diagnosed severe anxiety but no primary dementia.   Detailed testing, results and recommendations were reviewed.  There is a family history of Alzheimer's disease.  She has some insomnia.    In 2020, she was placed on Paxil and her mood they have done little bit better but she stopped because she felt her tinnitus was worse.  Her daughter reports that she had more issues with her memory starting in 2020.  She became more forgetful.  She would blame others for her mistakes.  She would have episodes of irritability and anger.  She had an evaluation at the [facility] geriatric center but walked out angry during the exam.  Symptoms progressed a little bit further in 2021.  In March 2022, she was placed on Zoloft but stopped after  2 weeks.  IMAGING: CT scan of the head dated 05/03/2021.  This showed no acute findings.  There was mild atrophy, typical for age.  There was chronic hypodense changes in the right anterior limb of the internal capsule, not present in 2007.   MRI of the brain 03/28/2006 showed minimal chronic microvascular ischemic change.    REVIEW OF SYSTEMS: Constitutional: No fevers, chills, sweats, or change in appetite Eyes: No visual changes, double vision, eye pain Ear, nose and throat: No hearing loss, ear pain, nasal congestion, sore throat Cardiovascular: No chest pain, palpitations Respiratory:  No shortness of breath at rest or with exertion.   No wheezes GastrointestinaI: No nausea, vomiting, diarrhea, abdominal pain, fecal incontinence Genitourinary:  No dysuria, urinary retention or frequency.  No nocturia. Musculoskeletal:  No neck pain, back pain Integumentary: No rash, pruritus, skin lesions Neurological: as above Psychiatric: As above endocrine: No palpitations, diaphoresis, change in appetite, change in weigh or increased thirst Hematologic/Lymphatic:  No anemia, purpura, petechiae. Allergic/Immunologic: No itchy/runny eyes, nasal congestion, recent allergic reactions, rashes  ALLERGIES: Allergies  Allergen Reactions   Tetracyclines & Related Other (See Comments)    Possibly caused c-diff     HOME MEDICATIONS:  Current Outpatient Medications:    ALPRAZolam (XANAX) 1 MG tablet, Take 0.5-2 mg by mouth See admin instructions. Take 1/2 tablet (0.5 mg) by mouth daily at 4pm, take 2 tablets (2 mg) at bedtime, Disp: , Rfl:    Ascorbic Acid (VITAMIN C) 1000 MG tablet, Take 1,000 mg by mouth daily., Disp: , Rfl:    B Complex Vitamins (B COMPLEX 50) TABS, Take 1 tablet by mouth daily., Disp: , Rfl:    Cholecalciferol (VITAMIN D3) 50 MCG (2000 UT) TABS, Take 2,000 Units by mouth daily., Disp: , Rfl:    donepezil (ARICEPT) 5 MG tablet, Take 1 tablet (5 mg total) by mouth at bedtime., Disp:  30 tablet, Rfl: 11   Homeopathic Products (ARNICARE) GEL, Apply 1 application topically daily as needed (pain)., Disp: , Rfl:    Ibuprofen-Acetaminophen 125-250 MG TABS, Take 1-2 tablets by mouth daily as needed (pain/headache)., Disp: , Rfl:    lamoTRIgine (LAMICTAL) 25 MG tablet, TAKE 3 TABLETS IN THE MORNING, 2 TABS AT 3PM, AND 3 TABLETS AT BEDTIME (Patient taking differently: TAKE 1 TABLETS IN THE MORNING, 1 TABS AT 3PM, AND 2 TABLETS AT BEDTIME), Disp: 720 tablet, Rfl: 1   LIDOCAINE EX, Apply 1 application topically daily as needed (pain)., Disp: , Rfl:    Lutein 20 MG TABS, Take 20  mg by mouth daily., Disp: , Rfl:    MAGNESIUM CITRATE PO, Take 368 mg by mouth daily. 184 mg X 2 tablets, Disp: , Rfl:    Omega 3 1000 MG CAPS, Take 1,000 mg by mouth daily., Disp: , Rfl:    OVER THE COUNTER MEDICATION, Take 1-2 tablets by mouth at bedtime as needed (sleep). Calms forte, Disp: , Rfl:    OVER THE COUNTER MEDICATION, Take 1-2 tablets by mouth at bedtime as needed (sleep). Best Sleep, Disp: , Rfl:    polyethylene glycol (MIRALAX / GLYCOLAX) 17 g packet, Take 17 g by mouth daily as needed (constipation)., Disp: , Rfl:    Probiotic Product (PROBIOTIC PO), Take 1 tablet by mouth daily., Disp: , Rfl:    Quercetin 500 MG CAPS, Take 500 mg by mouth 2 (two) times daily as needed (allergies)., Disp: , Rfl:    risperiDONE (RISPERDAL) 0.25 MG tablet, TAKE 1 TABLET BY MOUTH TWICE A DAY AS NEEDED **D/C OLANZAPINE**, Disp: 180 tablet, Rfl: 1   Zinc 20 MG CAPS, Take 20 mg by mouth daily., Disp: , Rfl:   PAST MEDICAL HISTORY: Past Medical History:  Diagnosis Date   Hyperthyroidism    Reflex sympathetic dystrophy    Vision abnormalities     PAST SURGICAL HISTORY: Past Surgical History:  Procedure Laterality Date   TONSILLECTOMY      FAMILY HISTORY: Family History  Problem Relation Age of Onset   Stroke Father    Dementia Paternal Aunt     SOCIAL HISTORY:  Social History   Socioeconomic History    Marital status: Married    Spouse name: Not on file   Number of children: Not on file   Years of education: Not on file   Highest education level: Not on file  Occupational History   Not on file  Tobacco Use   Smoking status: Never   Smokeless tobacco: Never  Substance and Sexual Activity   Alcohol use: No    Comment: 1-2 small glasses of wine per month   Drug use: No   Sexual activity: Not on file  Other Topics Concern   Not on file  Social History Narrative   Lives with husband   Right handed   Caffeine: 1 cup in the AM   Social Determinants of Health   Financial Resource Strain: Not on file  Food Insecurity: Not on file  Transportation Needs: Not on file  Physical Activity: Not on file  Stress: Not on file  Social Connections: Not on file  Intimate Partner Violence: Not on file     PHYSICAL EXAM  Vitals:   08/17/22 1455  BP: 133/81  Pulse: 81  Weight: 148 lb 8 oz (67.4 kg)  Height: 5\' 4"  (1.626 m)    Body mass index is 25.49 kg/m.   General: The patient is well-developed and well-nourished and in no acute distress  HEENT:  Head is [place]/AT.  Sclera are anicteric.  Skin: Extremities are without rash or  edema.  I saw no difference between the left and right ankle region  Neurologic Exam  Mental status: The patient is alert and oriented x 2 1/2 at the time of the examination.  She had reduced short-term memory.  Score was 27/30 on the MMSE.  Speech was normal.    Cranial nerves: Extraocular movements are full.   Facial symmetry is present.  .Facial strength is normal.  No obvious hearing deficits are noted.  Motor:  Muscle bulk is normal.  Tone is normal. Strength is  5 / 5 in all 4 extremities.   Sensory: Sensory testing is intact to pinprick, soft touch and vibration sensation in all 4 extremities.  Coordination: Cerebellar testing reveals good finger-nose-finger and heel-to-shin bilaterally.  Gait and station: Station is normal.   Gait is  normal. Tandem gait is mildly wide but normal for age.  She can turn in 3 steps.   Romberg is negative.   Reflexes: Deep tendon reflexes are symmetric and normal bilaterally.        ASSESSMENT AND PLAN  Memory loss  Dysesthesia  Depression with anxiety   continue lamotrigine 25 mg po qnoon, 25 mg po q evening and 50 mg to help the dysesthesias..    She did better today than in past on the MMSE scoring a normal 27/30.   She is anxious today but far less so than last couple visits.   I believer her extreme anxiety likely plays a role in her memory issues.    Brain volume was normal for age on CT scan earlier this year so probably not AD.    Continue donepezil for memory issues.  It may help her constipation also. She is on a very low-dose of Risperdal in the late afternoon and at night.   She is on alprazolam (PCP writes) I discussed a trial of tramadol to take on days where her groin pain is much worse..    Rtc 6 months or sooner if new or worsening issues   40-minute office visit with the majority of the time spent face-to-face for history and physical, discussion/counseling and decision-making.  Additional time with record review and documentation.    Liyla Radliff A. Epimenio Foot, MD, [facility] 08/17/2022, 3:29 PM Certified in Neurology, Clinical Neurophysiology, Sleep Medicine and Neuroimaging  [facility] [address], [address] [place], [place] [address] [phone]

## 2022-09-20 DIAGNOSIS — M810 Age-related osteoporosis without current pathological fracture: Secondary | ICD-10-CM | POA: Diagnosis not present

## 2022-09-20 DIAGNOSIS — R413 Other amnesia: Secondary | ICD-10-CM | POA: Diagnosis not present

## 2022-09-20 DIAGNOSIS — D75839 Thrombocytosis, unspecified: Secondary | ICD-10-CM | POA: Diagnosis not present

## 2022-09-20 DIAGNOSIS — Z79899 Other long term (current) drug therapy: Secondary | ICD-10-CM | POA: Diagnosis not present

## 2022-09-20 DIAGNOSIS — G9050 Complex regional pain syndrome I, unspecified: Secondary | ICD-10-CM | POA: Diagnosis not present

## 2022-09-20 DIAGNOSIS — Z0000 Encounter for general adult medical examination without abnormal findings: Secondary | ICD-10-CM | POA: Diagnosis not present

## 2022-09-20 DIAGNOSIS — E7800 Pure hypercholesterolemia, unspecified: Secondary | ICD-10-CM | POA: Diagnosis not present

## 2022-09-20 DIAGNOSIS — E039 Hypothyroidism, unspecified: Secondary | ICD-10-CM | POA: Diagnosis not present

## 2022-09-20 DIAGNOSIS — M797 Fibromyalgia: Secondary | ICD-10-CM | POA: Diagnosis not present

## 2022-10-03 ENCOUNTER — Other Ambulatory Visit: Payer: Medicare Other

## 2022-10-17 DIAGNOSIS — N390 Urinary tract infection, site not specified: Secondary | ICD-10-CM | POA: Diagnosis not present

## 2022-10-25 ENCOUNTER — Other Ambulatory Visit: Payer: Self-pay | Admitting: Neurology

## 2022-11-14 DIAGNOSIS — H2513 Age-related nuclear cataract, bilateral: Secondary | ICD-10-CM | POA: Diagnosis not present

## 2022-11-14 DIAGNOSIS — H25043 Posterior subcapsular polar age-related cataract, bilateral: Secondary | ICD-10-CM | POA: Diagnosis not present

## 2022-11-28 DIAGNOSIS — Z4689 Encounter for fitting and adjustment of other specified devices: Secondary | ICD-10-CM | POA: Diagnosis not present

## 2023-01-04 DIAGNOSIS — Z4689 Encounter for fitting and adjustment of other specified devices: Secondary | ICD-10-CM | POA: Diagnosis not present

## 2023-01-10 ENCOUNTER — Other Ambulatory Visit: Payer: Self-pay | Admitting: Neurology

## 2023-01-16 ENCOUNTER — Telehealth: Payer: Self-pay | Admitting: Neurology

## 2023-01-16 NOTE — Telephone Encounter (Signed)
LVM and sent mychart msg informing pt of need to reschedule 02/22/23 appointment - MD out 

## 2023-02-22 ENCOUNTER — Ambulatory Visit: Payer: Medicare Other | Admitting: Neurology

## 2023-03-01 ENCOUNTER — Ambulatory Visit: Payer: Medicare Other | Admitting: Neurology

## 2023-03-01 ENCOUNTER — Encounter: Payer: Self-pay | Admitting: Neurology

## 2023-03-01 VITALS — BP 127/76 | HR 77 | Ht 63.0 in | Wt 147.5 lb

## 2023-03-01 DIAGNOSIS — R208 Other disturbances of skin sensation: Secondary | ICD-10-CM | POA: Diagnosis not present

## 2023-03-01 DIAGNOSIS — G9050 Complex regional pain syndrome I, unspecified: Secondary | ICD-10-CM

## 2023-03-01 DIAGNOSIS — F418 Other specified anxiety disorders: Secondary | ICD-10-CM

## 2023-03-01 DIAGNOSIS — R413 Other amnesia: Secondary | ICD-10-CM

## 2023-03-01 MED ORDER — DONEPEZIL HCL 5 MG PO TABS: ORAL_TABLET | ORAL | 3 refills | Status: DC

## 2023-03-01 MED ORDER — RISPERIDONE 0.25 MG PO TABS: ORAL_TABLET | ORAL | 3 refills | Status: DC

## 2023-03-01 MED ORDER — LAMOTRIGINE 25 MG PO TABS: ORAL_TABLET | ORAL | 3 refills | Status: DC

## 2023-03-01 NOTE — Progress Notes (Signed)
[facility]  PATIENT: Tami Lewis DOB: Feb 12, 1941  REFERRING DOCTOR OR PCP: Lajean Manes, MD SOURCE: Patient, daughter, notes from primary care, imaging and lab reports, imaging studies personally reviewed.  _________________________________   HISTORICAL  CHIEF COMPLAINT:  Chief Complaint  Patient presents with   Follow-up    Pt in room 91, daughter in room. Here for memory follow up. Pt and daughter states memory is not any better. Pt last fall in Feb fell at home per patient, still has hip pain and lower back pain.MMSE:23    HISTORY OF PRESENT ILLNESS:  Tami Lewis is a 82 year old woman with painful dysesthesias/RSD, and memory loss.     Update 03/01/2023: She continues to note a lot of issues with her memory --- this is fluctuating and she was actually in normal range for the [facility]   She has had issues with he memory but this is not any worse than the year.  She actually did much better on the mini mental status exam scoring a normal 27/30 today.  She had had trouble completing the MMSE will be checked in December 2022 due to extreme anxiety and scored 22/30 the first time I saw her.  I discussed with her and her daughter that her performance today is good and most likely means that the anxiety was playing a very large role in her memory issues.  Her "RSD "pain is still bothering her.  She noted a flare-up recently when a pessary had to be removed.      Her ankle pain is not classic for RSD but she got the diagnosis in the past.  . .  She notes a vaginal burning sensation much of the time.  When pain is worse, she feels it is over her entire body.   She is having more pain in her right leg pain and also groin (has a prolapse causing a lot of discomfort.    She has seen gynecology for this but they have not found anything to target.      She is on lamotrigine currently 25-25-50 and it seems to help.   .    She feels higher dose of lamotrigine made  constipation worse.   Gabapentin had been tried in the past but due to constipation she stopped.   She has some urinary hesitancy at times.  She is on Xanax for anxiety.    She has a lot of rage/mood swings.   She continues to have some difficulty with cognition and scored only 22/30 on Mini-Mental status exam last year.   She had neurocognitive testing in 2018 (Dr. Bonita Quin).  At the time, she did not meet criteria for a dementia syndrome though she was found to have a lot of anxiety due to concerns of her somatic complaints and pain.       03/01/2023    2:03 PM 08/17/2022    3:03 PM 12/11/2021    4:18 PM  MMSE - Mini Mental State Exam  Orientation to time '3 4 2  '$ Orientation to Place '5 5 3  '$ Registration 3 3 0  Attention/ Calculation '1 4 1  '$ Recall 2 2 0  Language- name 2 objects '2 2 2  '$ Language- repeat '1 1 1  '$ Language- follow 3 step command '3 3 3  '$ Language- read & follow direction 1 1   Language-read & follow direction-comments   unable to complete  Write a sentence 1 1   Copy design 1 1   Total  score 23 27       ADDITIONAL HISTORY: I saw her in 2019 for memory loss and or RSD related pain in the right ankle.  She also was having some agitation and anxiety.  I prescribed lamotrigine to titrate up to 100 mg p.o. twice daily.  She never started the medication..  More recently, in May, she was prescribed gabapentin 100 mg p.o. 3 times daily but feels it causes constipation (difficult for her as she has a prolapsed uterus).  In 2019, she had neuro cognitive testing.  She saw Dr. Bonita Quin who diagnosed severe anxiety but no primary dementia.   Detailed testing, results and recommendations were reviewed.  There is a family history of Alzheimer's disease.  She has some insomnia.    In 2020, she was placed on Paxil and her mood they have done little bit better but she stopped because she felt her tinnitus was worse.  Her daughter reports that she had more issues with her memory starting in  2020.  She became more forgetful.  She would blame others for her mistakes.  She would have episodes of irritability and anger.  She had an evaluation at the [facility] but walked out angry during the exam.  Symptoms progressed a little bit further in 2021.  In March 2022, she was placed on Zoloft but stopped after 2 weeks.  IMAGING: CT scan of the head dated 05/03/2021.  This showed no acute findings.  There was mild atrophy, typical for age.  There was chronic hypodense changes in the right anterior limb of the internal capsule, not present in 2007.   MRI of the brain 03/28/2006 showed minimal chronic microvascular ischemic change.    REVIEW OF SYSTEMS: Constitutional: No fevers, chills, sweats, or change in appetite Eyes: No visual changes, double vision, eye pain Ear, nose and throat: No hearing loss, ear pain, nasal congestion, sore throat Cardiovascular: No chest pain, palpitations Respiratory:  No shortness of breath at rest or with exertion.   No wheezes GastrointestinaI: No nausea, vomiting, diarrhea, abdominal pain, fecal incontinence Genitourinary:  No dysuria, urinary retention or frequency.  No nocturia. Musculoskeletal:  No neck pain, back pain Integumentary: No rash, pruritus, skin lesions Neurological: as above Psychiatric: As above endocrine: No palpitations, diaphoresis, change in appetite, change in weigh or increased thirst Hematologic/Lymphatic:  No anemia, purpura, petechiae. Allergic/Immunologic: No itchy/runny eyes, nasal congestion, recent allergic reactions, rashes  ALLERGIES: Allergies  Allergen Reactions   Tetracyclines & Related Other (See Comments)    Possibly caused c-diff     HOME MEDICATIONS:  Current Outpatient Medications:    ALPRAZolam (XANAX) 1 MG tablet, Take 0.5-2 mg by mouth See admin instructions. Take 1/2 tablet (0.5 mg) by mouth daily at 4pm, take 2 tablets (2 mg) at bedtime, Disp: , Rfl:    Ascorbic Acid (VITAMIN C) 1000 MG  tablet, Take 1,000 mg by mouth daily., Disp: , Rfl:    B Complex Vitamins (B COMPLEX 50) TABS, Take 1 tablet by mouth daily., Disp: , Rfl:    Cholecalciferol (VITAMIN D3) 50 MCG (2000 UT) TABS, Take 2,000 Units by mouth daily., Disp: , Rfl:    Homeopathic Products (ARNICARE) GEL, Apply 1 application topically daily as needed (pain)., Disp: , Rfl:    Ibuprofen-Acetaminophen 125-250 MG TABS, Take 1-2 tablets by mouth daily as needed (pain/headache)., Disp: , Rfl:    LIDOCAINE EX, Apply 1 application topically daily as needed (pain)., Disp: , Rfl:    Lutein 20 MG  TABS, Take 20 mg by mouth daily., Disp: , Rfl:    MAGNESIUM CITRATE PO, Take 368 mg by mouth daily. 184 mg X 2 tablets, Disp: , Rfl:    Omega 3 1000 MG CAPS, Take 1,000 mg by mouth daily., Disp: , Rfl:    OVER THE COUNTER MEDICATION, Take 1-2 tablets by mouth at bedtime as needed (sleep). Calms forte, Disp: , Rfl:    OVER THE COUNTER MEDICATION, Take 1-2 tablets by mouth at bedtime as needed (sleep). Best Sleep, Disp: , Rfl:    polyethylene glycol (MIRALAX / GLYCOLAX) 17 g packet, Take 17 g by mouth daily as needed (constipation)., Disp: , Rfl:    Probiotic Product (PROBIOTIC PO), Take 1 tablet by mouth daily., Disp: , Rfl:    Quercetin 500 MG CAPS, Take 500 mg by mouth 2 (two) times daily as needed (allergies)., Disp: , Rfl:    traMADol (ULTRAM) 50 MG tablet, Take 1/2 to 1 pill po bid prn, Disp: 30 tablet, Rfl: 1   Zinc 20 MG CAPS, Take 20 mg by mouth daily., Disp: , Rfl:    donepezil (ARICEPT) 5 MG tablet, TAKE 1 TABLET BY MOUTH EVERYDAY AT BEDTIME, Disp: 90 tablet, Rfl: 3   lamoTRIgine (LAMICTAL) 25 MG tablet, TAKE 1 TABLETS IN THE MORNING, 1 TABS AT 3PM, AND 2 TABLETS AT BEDTIME, Disp: 360 tablet, Rfl: 3   risperiDONE (RISPERDAL) 0.25 MG tablet, One po bid, Disp: 180 tablet, Rfl: 3  PAST MEDICAL HISTORY: Past Medical History:  Diagnosis Date   Hyperthyroidism    Reflex sympathetic dystrophy    Vision abnormalities     PAST  SURGICAL HISTORY: Past Surgical History:  Procedure Laterality Date   TONSILLECTOMY      FAMILY HISTORY: Family History  Problem Relation Age of Onset   Stroke Father    Dementia Paternal Aunt     SOCIAL HISTORY:  Social History   Socioeconomic History   Marital status: Married    Spouse name: Not on file   Number of children: Not on file   Years of education: Not on file   Highest education level: Not on file  Occupational History   Not on file  Tobacco Use   Smoking status: Never   Smokeless tobacco: Never  Substance and Sexual Activity   Alcohol use: No    Comment: 1-2 small glasses of wine per month   Drug use: No   Sexual activity: Not on file  Other Topics Concern   Not on file  Social History Narrative   Lives with husband   Right handed   Caffeine: 1 cup in the AM   Social Determinants of Health   Financial Resource Strain: Not on file  Food Insecurity: Not on file  Transportation Needs: Not on file  Physical Activity: Not on file  Stress: Not on file  Social Connections: Not on file  Intimate Partner Violence: Not on file     PHYSICAL EXAM  Vitals:   03/01/23 1355  BP: 127/76  Pulse: 77  Weight: 147 lb 8 oz (66.9 kg)  Height: '5\' 3"'$  (1.6 m)    Body mass index is 26.13 kg/m.   General: The patient is well-developed and well-nourished and in no acute distress  HEENT:  Head is [place]/AT.  Sclera are anicteric.  Skin: Extremities are without rash or  edema.  I saw no difference between the left and right ankle region  Neurologic Exam  Mental status: The patient is alert and oriented  x 2 1/2 at the time of the examination.  She had reduced short-term memory.  Score was 23/30 on the MMSE.Marland Kitchen  Speech was normal.    Cranial nerves: Extraocular movements are full.   Facial symmetry is present.  .Facial strength is normal.  No obvious hearing deficits are noted.  Motor:  Muscle bulk is normal.   Tone is normal. Strength is  5 / 5 in all 4  extremities.   Sensory: Sensory testing is intact to touch.  Coordination: Cerebellar testing reveals good finger-nose-finger and heel-to-shin bilaterally.  Gait and station: Station is normal.   Gait is normal. Her tandem gait is mildly wide but normal for age.  She can turn in 3 steps.   Romberg is negative.   Reflexes: Deep tendon reflexes are symmetric and normal bilaterally.        ASSESSMENT AND PLAN  Memory loss  Dysesthesia  RSD (reflex sympathetic dystrophy)  Depression with anxiety   continue lamotrigine 25 mg po qnoon, 25 mg po q evening and 50 mg to help the dysesthesias..   we discussed alpha-lipoic acid 600 mg bid [place] was 23/30 (was 27/30 last visit but lower the visit before) Continue donepezil for memory issues.  It may help her constipation also. She is on a very low-dose of Risperdal in the late afternoon and at night.   She is on alprazolam (PCP writes) [place] for CBD. Rtc 6 months or sooner if new or worsening issues     Keone Kamer A. Felecia Shelling, MD, [facility] [id number], [id number] PM Certified in Neurology, Clinical Neurophysiology, Sleep Medicine and Neuroimaging  [facility] [address] [place], [place] [address] [phone]

## 2023-03-01 NOTE — Patient Instructions (Signed)
Trial of alpha-lipoic acid 600 mg po bid

## 2023-04-27 DIAGNOSIS — N390 Urinary tract infection, site not specified: Secondary | ICD-10-CM | POA: Diagnosis not present

## 2023-05-03 IMAGING — CT CT HEAD W/O CM
4 series · 16 of 47 positions shown, 18 images · non-contrast
Comparison: MRI brain September 27, 2006

CLINICAL DATA: Dizziness

EXAM:
CT HEAD WITHOUT CONTRAST
TECHNIQUE: Contiguous axial images were obtained from the base of the skull
through the vertex without intravenous contrast.

[Series 2: head 5.00 hr40 s3 axial ibhc · axial · 0.42mm/px · z∈[-957,-837]mm · 7 of 32 slices shown, 9 images]
[im 4/32  brain]
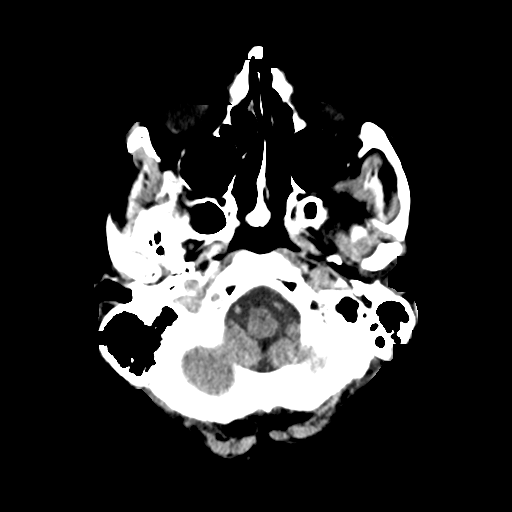
[im 4/32  bone]
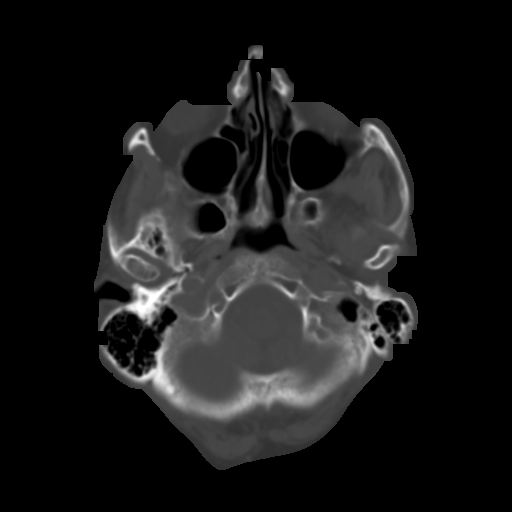
[im 8/32  brain]
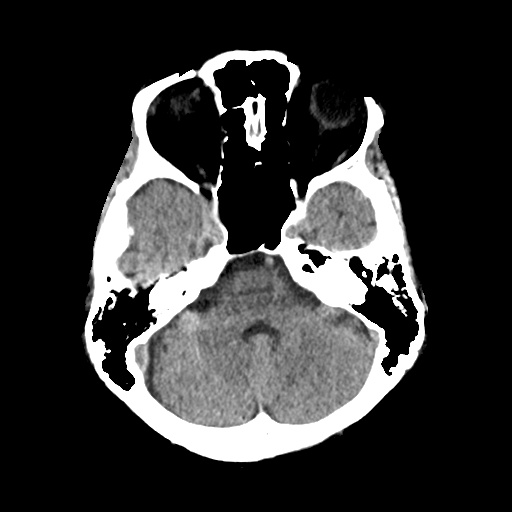
[im 12/32  brain]
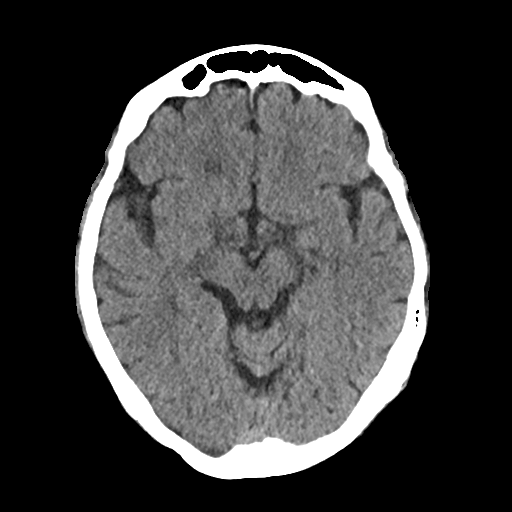
[im 16/32  brain]
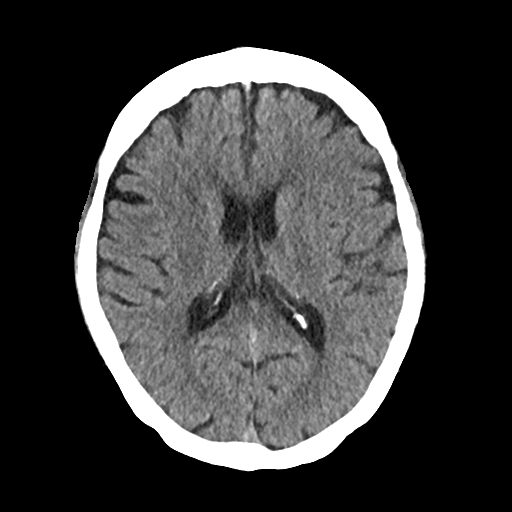
[im 20/32  brain]
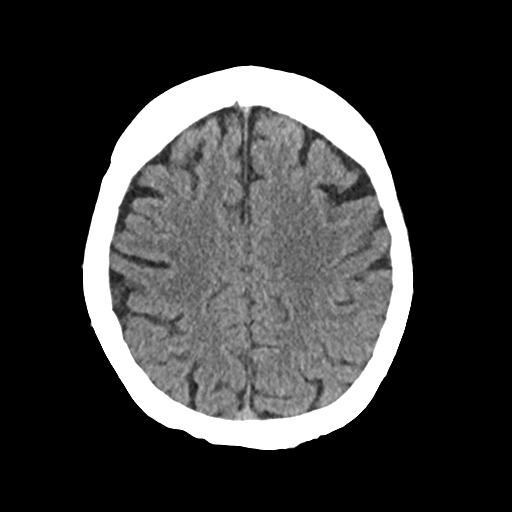
[im 20/32  bone]
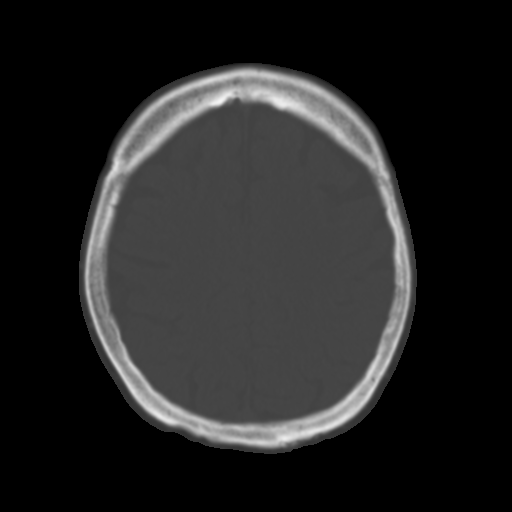
[im 24/32  brain]
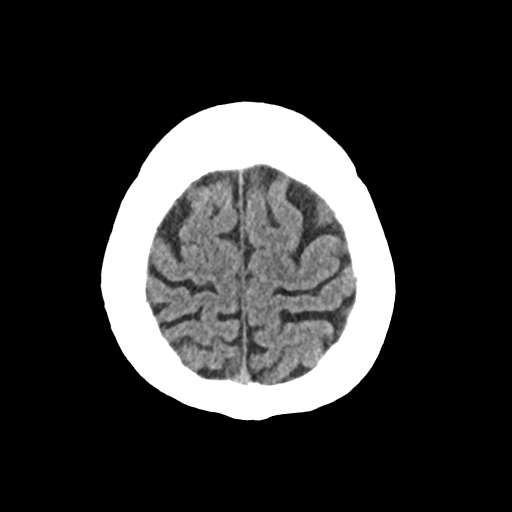
[im 28/32  brain]
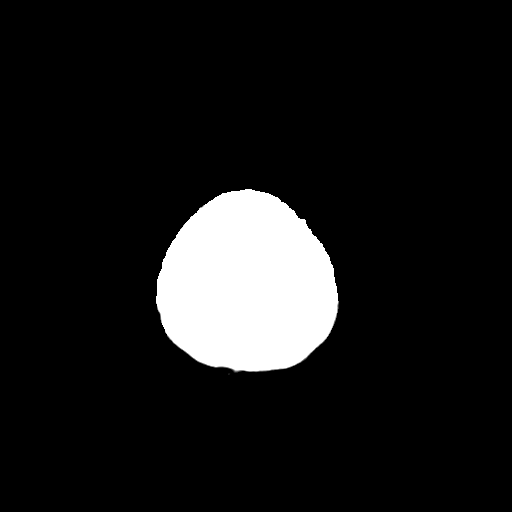

[Series 3: head 2.00 hr60 s3 axial bone · axial · 0.42mm/px · z∈[-959,-927]mm · 3 of 80 slices shown]
[im 8/80  bone]
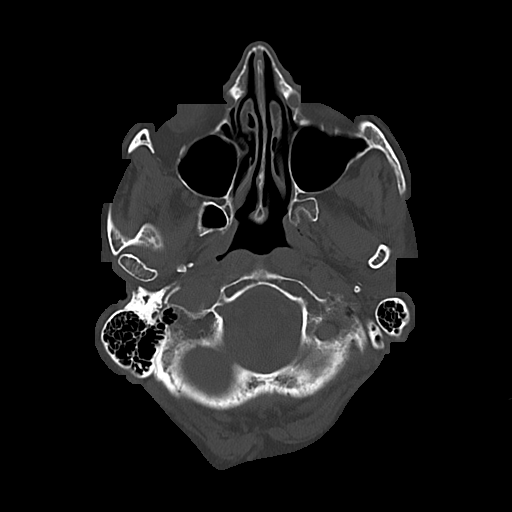
[im 16/80  bone]
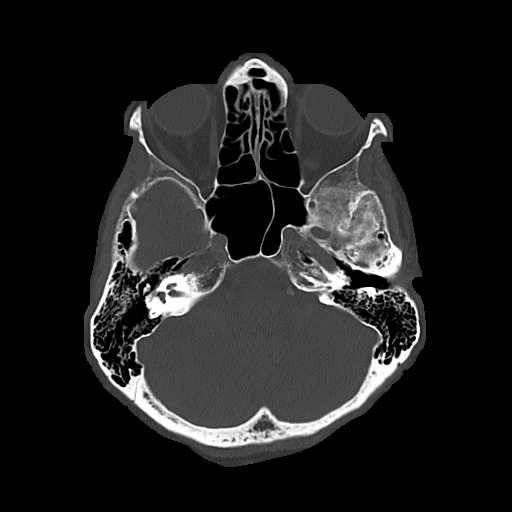
[im 24/80  bone]
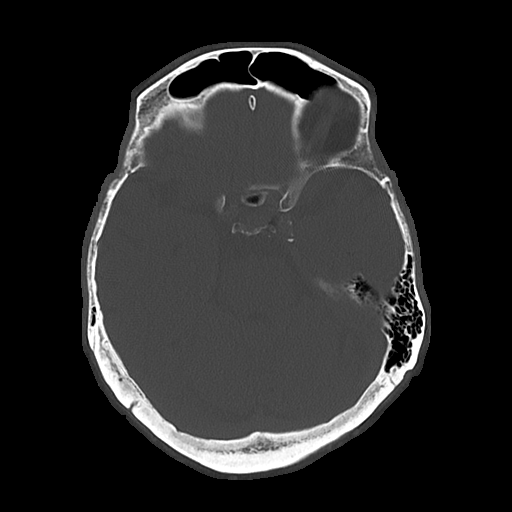

[Series 4: head 3.00 hr40 s3 sag · sagittal · 0.32mm/px · 3 of 71 slices shown]
[im 24/71  brain]
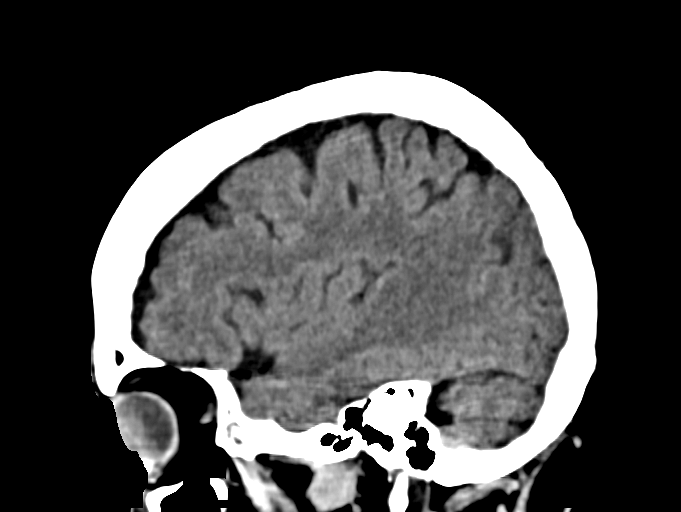
[im 36/71  brain]
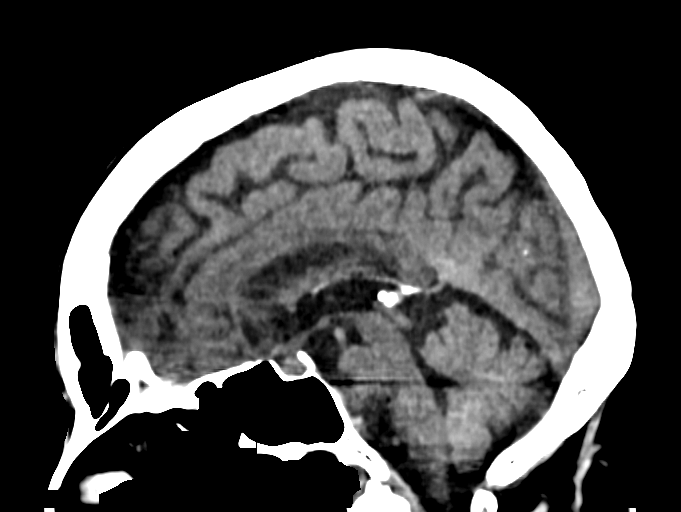
[im 47/71  brain]
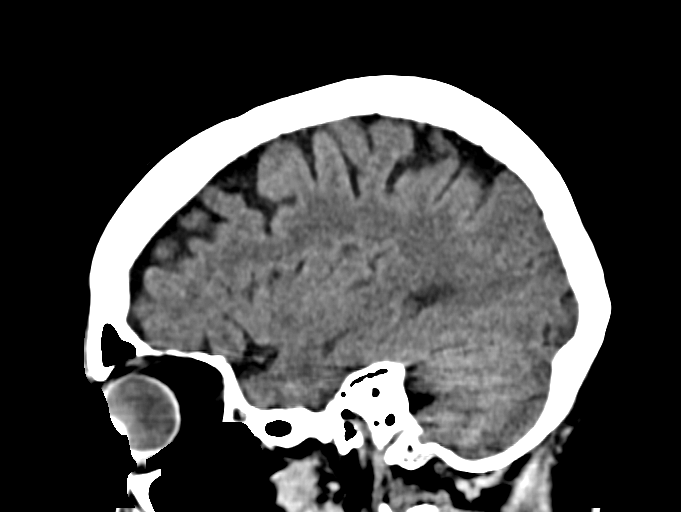

[Series 6: head 3.00 hr40 s3 cor · coronal · 0.32mm/px · 3 of 71 slices shown]
[im 24/71  brain]
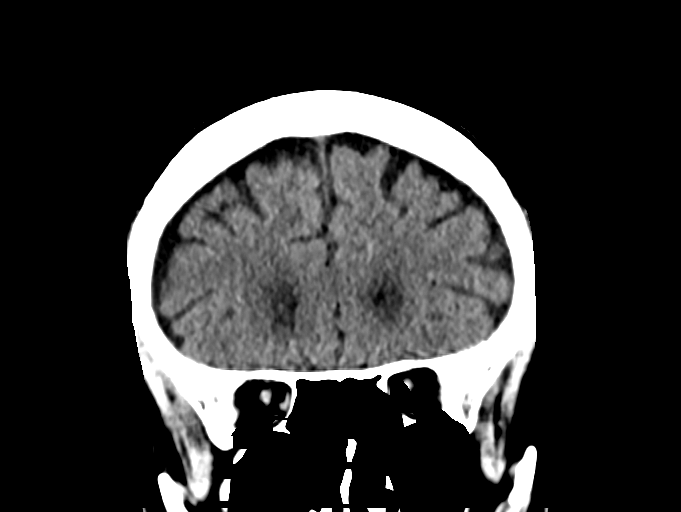
[im 32/71  brain]
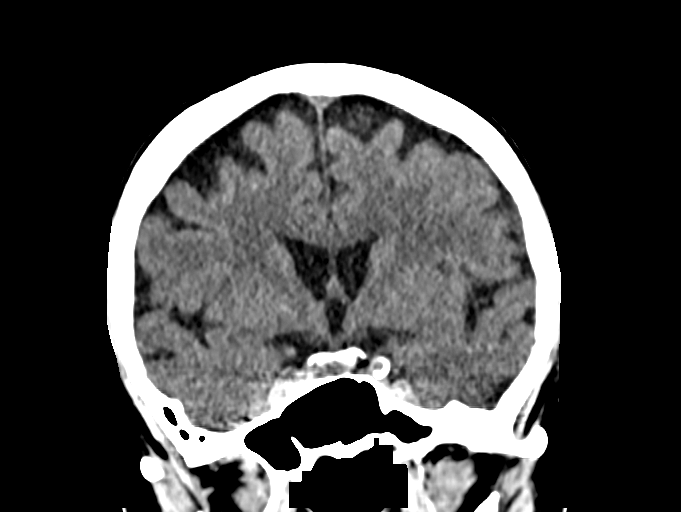
[im 39/71  brain]
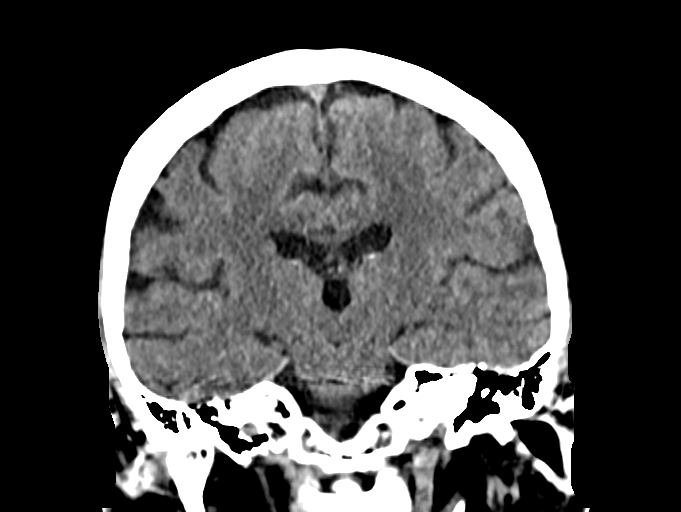

[16 of 47 positions shown; findings below may reference images not displayed]

FINDINGS: Brain: No evidence of acute infarction, hemorrhage, hydrocephalus,
extra-axial collection or mass lesion/mass effect.

Vascular: No hyperdense vessel. Atherosclerotic calcifications of
the internal carotid arteries at skull base.

Skull: Normal. Negative for fracture or focal lesion.

Sinuses/Orbits: The visualized paranasal sinuses and mastoid air
cells are predominantly clear. Orbits are grossly unremarkable.

Other: None.
IMPRESSION: No acute intracranial findings.

## 2023-06-24 IMAGING — US US BREAST*R* LIMITED INC AXILLA
1 series · 7 of 7 positions shown · non-contrast
Comparison: None.

CLINICAL DATA: Patient presents with a palpable lump in her left
breast. Patient's referring clinician also reports a lump in the 12
o'clock position of the right breast. This patient does not undergo
mammography due to having RSD and has no prior available imaging.
Patient has a remote history of a fibroadenoma removed from the left
breast.

EXAM:
ULTRASOUND OF THE BILATERAL BREAST

[Series 1: us breast*right* limited inc axilla · 0.06mm/px · 7 of 7 slices shown]
[im 1/7]
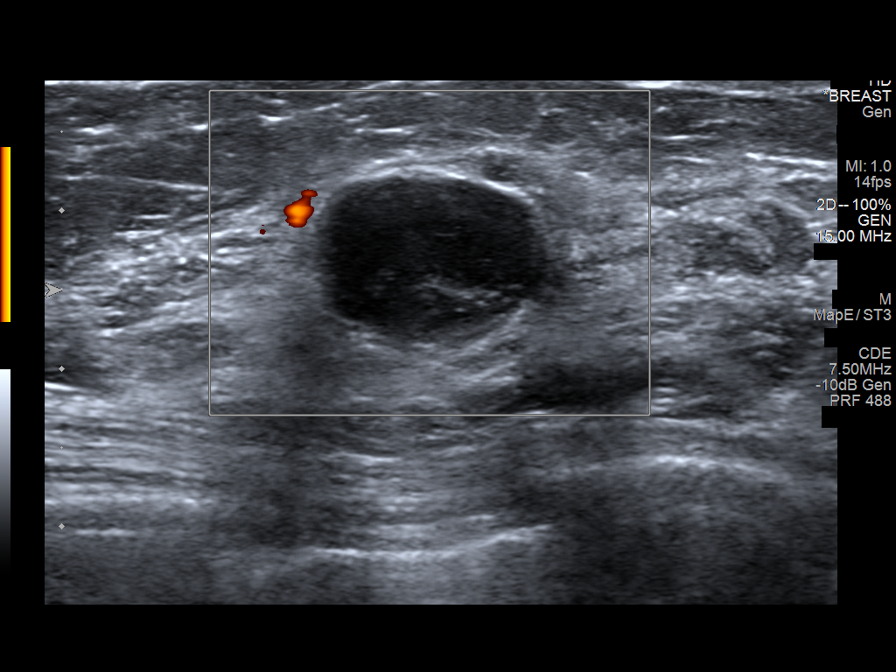
[im 2/7]
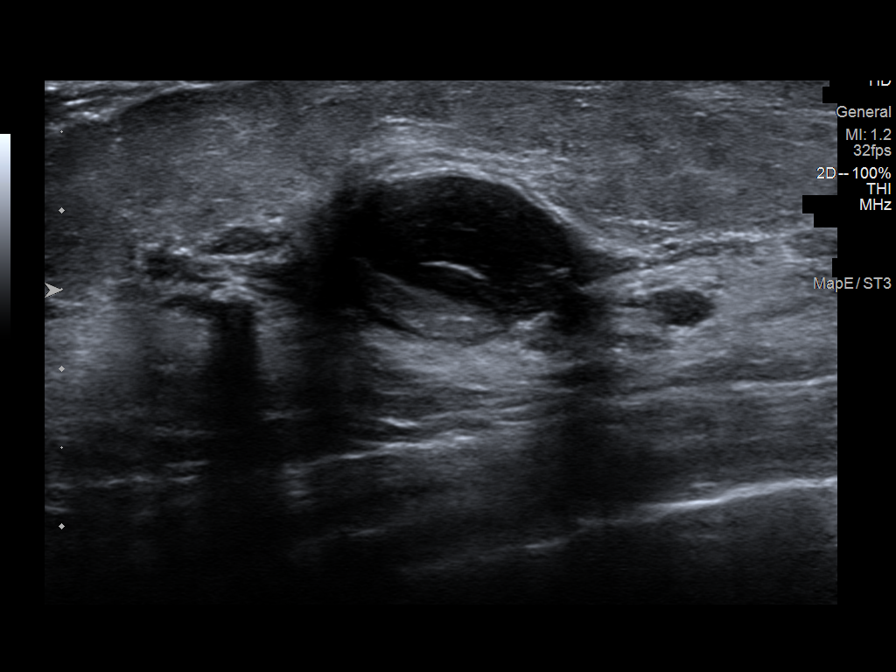
[im 3/7]
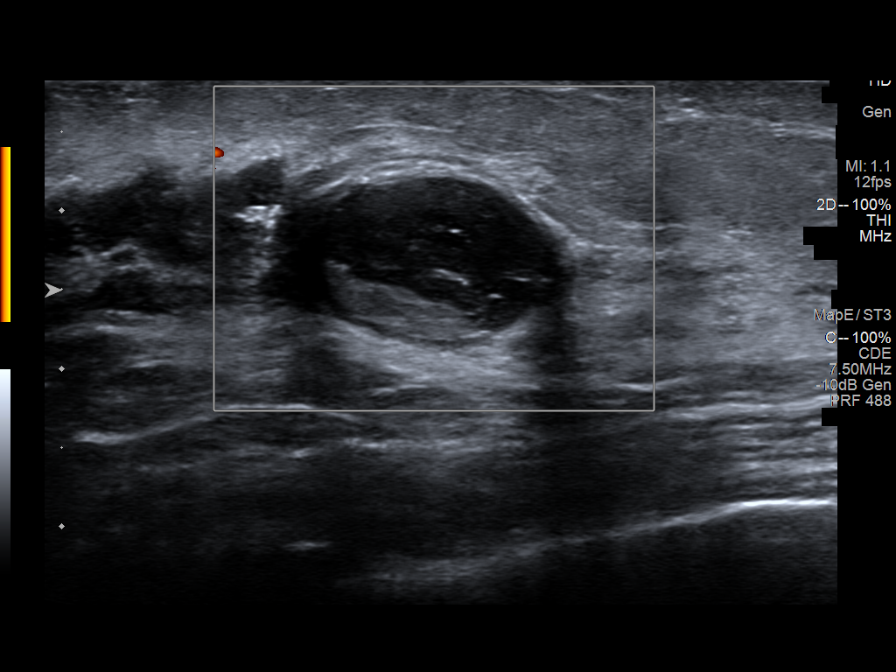
[im 4/7]
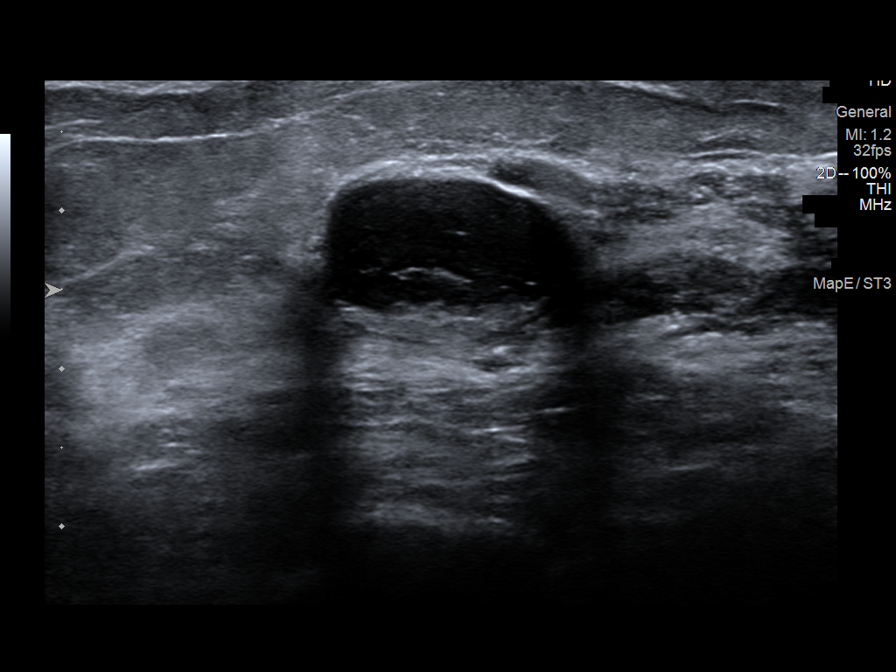
[im 5/7]
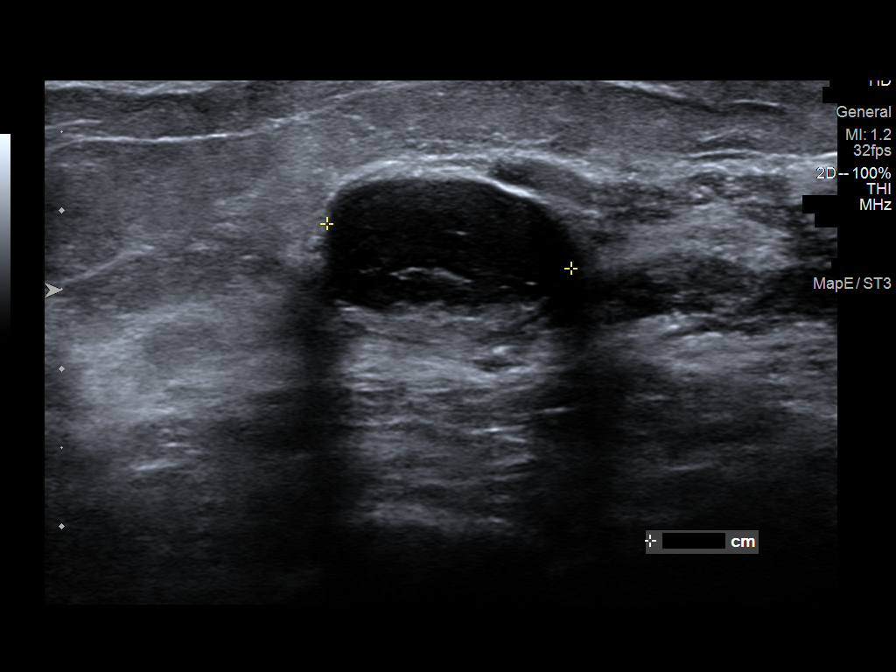
[im 6/7]
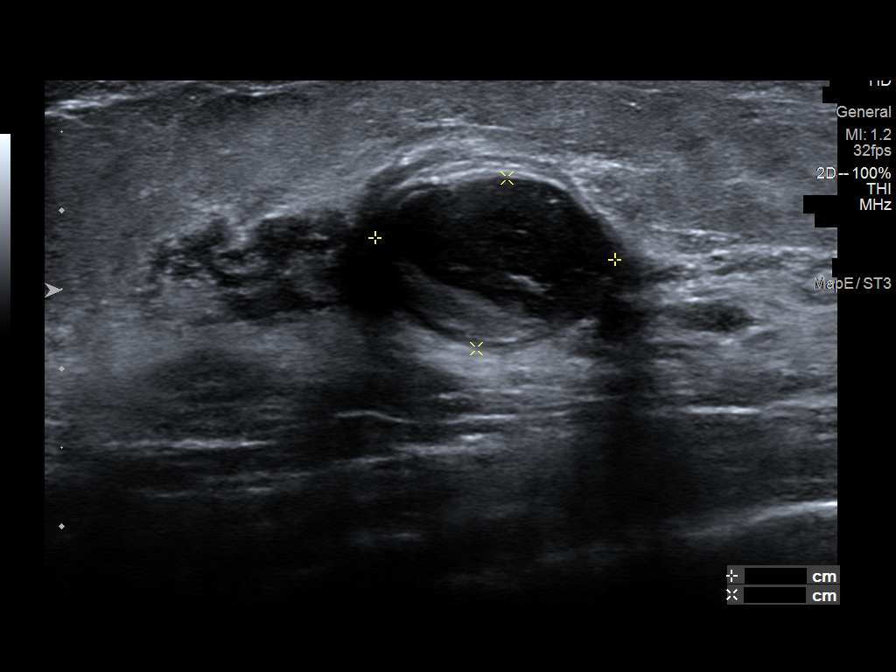
[im 7/7]
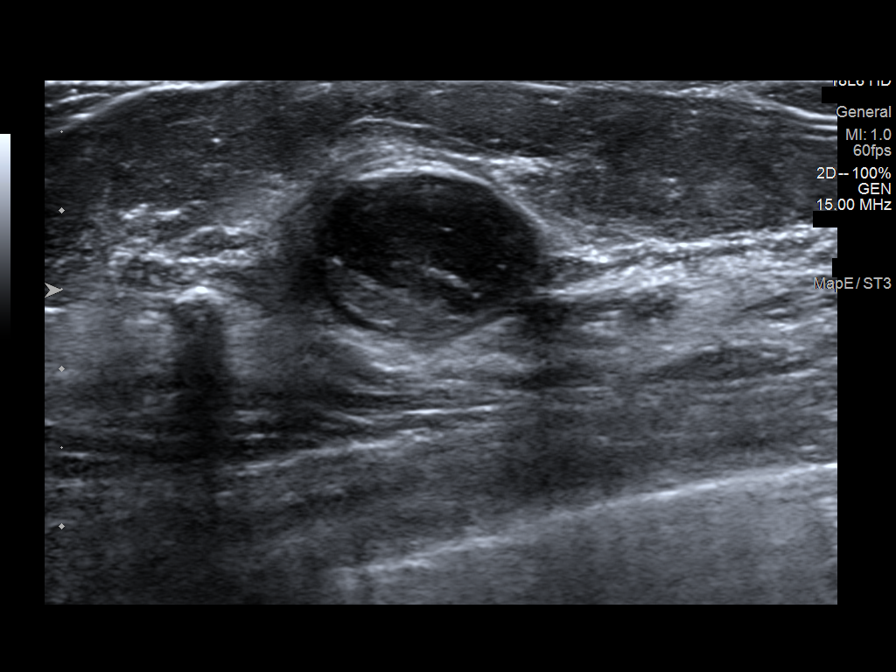

[7 of 7 positions shown; findings below may reference images not displayed]

FINDINGS: On physical exam, there is a palpable lump in the left breast at
approximately 2 o'clock which corresponds to the patient's reported
palpable abnormality.

Targeted left breast ultrasound is performed, showing 2 adjacent
simple cysts at 2 o'clock, 2 and 3 cm from the nipple, the largest,
which corresponds to the palpable abnormality, measures 2.5 x 1.8 x
2.0 cm. The smaller measures 1.1 x 1.0 x 1.0 cm. No solid masses or
suspicious lesions in the upper outer quadrant.

Targeted right breast ultrasound is performed, showing a hypoechoic
circumscribed oval mass in the 12 o'clock position, 1 cm from the
nipple, measuring 1.6 x 1.1 x 1.5 cm. This corresponds to the
reported location of the mass felt by the patient's referring
clinician. There are internal areas of increased echogenicity
consistent with calcification.
IMPRESSION: 1. Probably benign mass in the right breast at 12 o'clock, most
likely a fibroadenoma. Short-term follow-up recommended.
2. Benign cysts in the left breast at 2 o'clock, the largest
corresponding to the patient's palpable abnormality.

RECOMMENDATION:
1. Right breast ultrasound in 6 months to reassess the 12 o'clock
position probably benign mass.
2. Since this patient has not been undergoing any breast cancer
screening imaging, and due to her inability to undergo mammography,
she should be considered for annual screening breast ultrasound.

I have discussed the findings and recommendations with the patient.
If applicable, a reminder letter will be sent to the patient
regarding the next appointment.

BI-RADS CATEGORY  3: Probably benign.

## 2023-08-03 DIAGNOSIS — R300 Dysuria: Secondary | ICD-10-CM | POA: Diagnosis not present

## 2023-09-27 DIAGNOSIS — M797 Fibromyalgia: Secondary | ICD-10-CM | POA: Diagnosis not present

## 2023-09-27 DIAGNOSIS — G9050 Complex regional pain syndrome I, unspecified: Secondary | ICD-10-CM | POA: Diagnosis not present

## 2023-09-27 DIAGNOSIS — Z79899 Other long term (current) drug therapy: Secondary | ICD-10-CM | POA: Diagnosis not present

## 2023-09-27 DIAGNOSIS — R413 Other amnesia: Secondary | ICD-10-CM | POA: Diagnosis not present

## 2023-09-27 DIAGNOSIS — E039 Hypothyroidism, unspecified: Secondary | ICD-10-CM | POA: Diagnosis not present

## 2023-09-27 DIAGNOSIS — E7800 Pure hypercholesterolemia, unspecified: Secondary | ICD-10-CM | POA: Diagnosis not present

## 2023-09-27 DIAGNOSIS — Z0000 Encounter for general adult medical examination without abnormal findings: Secondary | ICD-10-CM | POA: Diagnosis not present

## 2023-09-27 DIAGNOSIS — K5909 Other constipation: Secondary | ICD-10-CM | POA: Diagnosis not present

## 2023-09-27 DIAGNOSIS — M810 Age-related osteoporosis without current pathological fracture: Secondary | ICD-10-CM | POA: Diagnosis not present

## 2023-09-27 DIAGNOSIS — D75839 Thrombocytosis, unspecified: Secondary | ICD-10-CM | POA: Diagnosis not present

## 2023-12-06 ENCOUNTER — Ambulatory Visit: Payer: Medicare Other | Admitting: Neurology

## 2023-12-13 ENCOUNTER — Ambulatory Visit: Payer: Medicare Other | Admitting: Neurology

## 2024-01-01 ENCOUNTER — Ambulatory Visit: Payer: Medicare Other | Admitting: Neurology

## 2024-01-01 ENCOUNTER — Encounter: Payer: Self-pay | Admitting: Neurology

## 2024-01-01 VITALS — BP 138/86 | HR 91 | Ht 63.0 in | Wt 147.0 lb

## 2024-01-01 DIAGNOSIS — R413 Other amnesia: Secondary | ICD-10-CM

## 2024-01-01 DIAGNOSIS — R208 Other disturbances of skin sensation: Secondary | ICD-10-CM | POA: Diagnosis not present

## 2024-01-01 DIAGNOSIS — F03A11 Unspecified dementia, mild, with agitation: Secondary | ICD-10-CM | POA: Diagnosis not present

## 2024-01-01 DIAGNOSIS — F418 Other specified anxiety disorders: Secondary | ICD-10-CM | POA: Diagnosis not present

## 2024-01-01 NOTE — Progress Notes (Signed)
 [facility]  PATIENT: Tami Lewis DOB: 1941/06/10  REFERRING DOCTOR OR PCP: Denise Manly, MD SOURCE: Patient, daughter, notes from primary care, imaging and lab reports, imaging studies personally reviewed.  _________________________________   HISTORICAL  CHIEF COMPLAINT:  Chief Complaint  Patient presents with   Follow-up    Pt in room 10., daughter in room. Here for memory follow up. Pt said memory is okay, daughter said it depends on the day.  Pt states RSD has pain everywhere, trouble with balance. MMSE:22    HISTORY OF PRESENT ILLNESS:  Tami Lewis is a 83 year old woman with painful dysesthesias/RSD, and memory loss.     Update 12/31/2022: She continues to note a lot of issues with her memory --- this is fluctuating and she was actually in normal range for the MMSE in 2023.    She scored 22/30 today on the MMSE (was 23/30 last visit, 27/30 previous visit)    She has anxiety and depression.   She is on Xanax  for anxiety.    She has a lot of rage/mood swings.   She continues to have some difficulty with cognition and scored only 22/30 on Mini-Mental status exam last year.   She had neurocognitive testing in 2018 (Dr. Shelle).  At the time, she did not meet criteria for a dementia syndrome though she was found to have a lot of anxiety due to concerns of her somatic complaints and pain.    She continues note a lot of burning dysesthesia.     This is worse in the afternoon.   She has pain in her pelvic region as well.    She had been diagnosed with RSD in the past though symptoms are nt typical and I question that diagnosis.     She is on lamotrigine  currently 25-25-50 and it seems to help.    She feels higher dose of lamotrigine  made constipation worse.   Gabapentin had been tried in the past but due to constipation she stopped.   She has some urinary hesitancy at times.    She takes alpha-lipoic acid   She also notes more issues with her balance and  gait.  She uses a walking stick in the morning and evening.   She had one fall last year.      She is concerned about constipation though daughter notes she is having less issue with it.  She goes to bed around 11 pm and wakes up around 9-10 but often does not get out of bed until 11 am.   She uses a CBD/THC once or twice a month if unable to sleep.          01/01/2024    2:28 PM 03/01/2023    2:03 PM 08/17/2022    3:03 PM  MMSE - Mini Mental State Exam  Orientation to time 4 3 4   Orientation to Place 4 5 5   Registration 3 3 3   Attention/ Calculation 2 1 4   Recall 2 2 2   Language- name 2 objects 2 2 2   Language- repeat 1 1 1   Language- follow 3 step command 1 3 3   Language- read & follow direction 1 1 1   Write a sentence 1 1 1   Copy design 1 1 1   Total score 22 23 27       ADDITIONAL HISTORY: I saw her in 2019 for memory loss and or RSD related pain in the right ankle.  She also was having some agitation and anxiety.  I prescribed lamotrigine  to titrate up to 100 mg p.o. twice daily.  She never started the medication..  More recently, in May, she was prescribed gabapentin 100 mg p.o. 3 times daily but feels it causes constipation (difficult for her as she has a prolapsed uterus).  In 2019, she had neuro cognitive testing.  She saw Dr. Shelle who diagnosed severe anxiety but no primary dementia.   Detailed testing, results and recommendations were reviewed.  There is a family history of Alzheimer's disease.  She has some insomnia.    In 2020, she was placed on Paxil and her mood they have done little bit better but she stopped because she felt her tinnitus was worse.  Her daughter reports that she had more issues with her memory starting in 2020.  She became more forgetful.  She would blame others for her mistakes.  She would have episodes of irritability and anger.  She had an evaluation at the [facility] but walked out angry during the exam.  Symptoms progressed a  little bit further in 2021.  In March 2022, she was placed on Zoloft but stopped after 2 weeks.  IMAGING: CT scan of the head dated 05/03/2021.  This showed no acute findings.  There was mild atrophy, typical for age.  There was chronic hypodense changes in the right anterior limb of the internal capsule, not present in 2007.   MRI of the brain 03/28/2006 showed minimal chronic microvascular ischemic change.    REVIEW OF SYSTEMS: Constitutional: No fevers, chills, sweats, or change in appetite Eyes: No visual changes, double vision, eye pain Ear, nose and throat: No hearing loss, ear pain, nasal congestion, sore throat Cardiovascular: No chest pain, palpitations Respiratory:  No shortness of breath at rest or with exertion.   No wheezes GastrointestinaI: No nausea, vomiting, diarrhea, abdominal pain, fecal incontinence Genitourinary:  No dysuria, urinary retention or frequency.  No nocturia. Musculoskeletal:  No neck pain, back pain Integumentary: No rash, pruritus, skin lesions Neurological: as above Psychiatric: As above endocrine: No palpitations, diaphoresis, change in appetite, change in weigh or increased thirst Hematologic/Lymphatic:  No anemia, purpura, petechiae. Allergic/Immunologic: No itchy/runny eyes, nasal congestion, recent allergic reactions, rashes  ALLERGIES: Allergies  Allergen Reactions   Tetracyclines & Related Other (See Comments)    Possibly caused c-diff     HOME MEDICATIONS:  Current Outpatient Medications:    ALPRAZolam  (XANAX ) 1 MG tablet, Take 0.5-2 mg by mouth See admin instructions. Take 1/2 tablet (0.5 mg) by mouth daily at 4pm, take 2 tablets (2 mg) at bedtime, Disp: , Rfl:    Ascorbic Acid (VITAMIN C) 1000 MG tablet, Take 1,000 mg by mouth daily., Disp: , Rfl:    B Complex Vitamins (B COMPLEX 50) TABS, Take 1 tablet by mouth daily., Disp: , Rfl:    Cholecalciferol (VITAMIN D3) 50 MCG (2000 UT) TABS, Take 2,000 Units by mouth daily., Disp: , Rfl:     donepezil  (ARICEPT ) 5 MG tablet, TAKE 1 TABLET BY MOUTH EVERYDAY AT BEDTIME, Disp: 90 tablet, Rfl: 3   Homeopathic Products (ARNICARE) GEL, Apply 1 application topically daily as needed (pain)., Disp: , Rfl:    Ibuprofen-Acetaminophen 125-250 MG TABS, Take 1-2 tablets by mouth daily as needed (pain/headache)., Disp: , Rfl:    lamoTRIgine  (LAMICTAL ) 25 MG tablet, TAKE 1 TABLETS IN THE MORNING, 1 TABS AT 3PM, AND 2 TABLETS AT BEDTIME, Disp: 360 tablet, Rfl: 3   LIDOCAINE  EX, Apply 1 application topically daily as needed (pain).,  Disp: , Rfl:    Lutein 20 MG TABS, Take 20 mg by mouth daily., Disp: , Rfl:    MAGNESIUM CITRATE PO, Take 368 mg by mouth daily. 184 mg X 2 tablets, Disp: , Rfl:    Omega 3 1000 MG CAPS, Take 1,000 mg by mouth daily., Disp: , Rfl:    OVER THE COUNTER MEDICATION, Take 1-2 tablets by mouth at bedtime as needed (sleep). Calms forte, Disp: , Rfl:    OVER THE COUNTER MEDICATION, Take 1-2 tablets by mouth at bedtime as needed (sleep). Best Sleep, Disp: , Rfl:    polyethylene glycol (MIRALAX  / GLYCOLAX ) 17 g packet, Take 17 g by mouth daily as needed (constipation)., Disp: , Rfl:    Probiotic Product (PROBIOTIC PO), Take 1 tablet by mouth daily., Disp: , Rfl:    Quercetin 500 MG CAPS, Take 500 mg by mouth 2 (two) times daily as needed (allergies)., Disp: , Rfl:    risperiDONE  (RISPERDAL ) 0.25 MG tablet, One po bid, Disp: 180 tablet, Rfl: 3   Zinc 20 MG CAPS, Take 20 mg by mouth daily., Disp: , Rfl:    traMADol  (ULTRAM ) 50 MG tablet, Take 1/2 to 1 pill po bid prn (Patient not taking: Reported on 01/01/2024), Disp: 30 tablet, Rfl: 1  PAST MEDICAL HISTORY: Past Medical History:  Diagnosis Date   Hyperthyroidism    Reflex sympathetic dystrophy    Vision abnormalities     PAST SURGICAL HISTORY: Past Surgical History:  Procedure Laterality Date   TONSILLECTOMY      FAMILY HISTORY: Family History  Problem Relation Age of Onset   Stroke Father    Dementia Paternal Aunt      SOCIAL HISTORY:  Social History   Socioeconomic History   Marital status: Married    Spouse name: Not on file   Number of children: Not on file   Years of education: Not on file   Highest education level: Not on file  Occupational History   Not on file  Tobacco Use   Smoking status: Never   Smokeless tobacco: Never  Substance and Sexual Activity   Alcohol use: No    Comment: 1-2 small glasses of wine per month   Drug use: No   Sexual activity: Not on file  Other Topics Concern   Not on file  Social History Narrative   Lives with husband   Right handed   Caffeine: 1 cup in the AM   Social Drivers of Health   Financial Resource Strain: Not on file  Food Insecurity: Not on file  Transportation Needs: Not on file  Physical Activity: Not on file  Stress: Not on file  Social Connections: Unknown (05/09/2022)   Received from [facility]   Social Network    Social Network: Not on file  Intimate Partner Violence: Unknown (03/30/2022)   Received from [facility], [facility]   HITS    Physically Hurt: Not on file    Insult or Talk Down To: Not on file    Threaten Physical Harm: Not on file    Scream or Curse: Not on file     PHYSICAL EXAM  Vitals:   01/01/24 1419  BP: 138/86  Pulse: 91  Weight: 147 lb (66.7 kg)  Height: 5' 3 (1.6 m)    Body mass index is 26.04 kg/m.   General: The patient is well-developed and well-nourished and in no acute distress  HEENT:  Head is [place]/AT.  Sclera are anicteric.  Skin: Extremities are without rash or  edema.  I saw no difference between the left and right ankle region  Neurologic Exam  Mental status: The patient is alert and oriented x 2 1/2 at the time of the examination.  She had reduced short-term memory.  Score was 23/30 on the MMSE.SABRA  Speech was normal.    Cranial nerves: Extraocular movements are full.   Facial symmetry is present.  .Facial strength is normal.  No obvious hearing deficits  are noted.  Motor:  Muscle bulk is normal.   Tone is normal. Strength is  5 / 5 in all 4 extremities.   Sensory: Sensory testing is intact to touch.  Coordination: Cerebellar testing reveals good finger-nose-finger and heel-to-shin bilaterally.  Gait and station: Station is normal.   Gait is normal. Her tandem gait is mildly wide but normal for age.  She can turn in 3 steps.   Romberg is negative.   Reflexes: Deep tendon reflexes are symmetric and normal bilaterally.        ASSESSMENT AND PLAN  Mild dementia with agitation, unspecified dementia type (HCC)  Dysesthesia  Memory loss  Depression with anxiety   continue lamotrigine  25 mg po q noon, 25 mg po q evening and 50 mg qHS to help the dysesthesias..   we discussed alpha-lipoic acid 600 mg bid MMSE was 22/30 (mild dementia).  We will check ATN test today.   If not consistent with Alzheimer's, we could stop the donepezil  to simplify her regimen. She is on a very low-dose of Risperdal  in the late afternoon and at night.   Her daughter notes that this has been very helpful with less agitation she is on alprazolam  (PCP writes) Ok for CBD. Rtc 6 months or sooner if new or worsening issues   40-minute office visit with the majority of the time spent face-to-face for history and physical, discussion/counseling and decision-making.  Additional time with record review and documentation.  This visit is part of a comprehensive longitudinal care medical relationship regarding the patients primary diagnosis of cognitive impairment and related concerns.   Nayellie Sanseverino A. Vear, MD, [facility] 01/01/2024, 3:04 PM Certified in Neurology, Clinical Neurophysiology, Sleep Medicine and Neuroimaging  [facility] [address] [place], [place] [address] [phone]

## 2024-01-04 ENCOUNTER — Telehealth: Payer: Self-pay | Admitting: Neurology

## 2024-01-04 DIAGNOSIS — R413 Other amnesia: Secondary | ICD-10-CM

## 2024-01-04 DIAGNOSIS — F03A11 Unspecified dementia, mild, with agitation: Secondary | ICD-10-CM

## 2024-01-04 LAB — ATN PROFILE
A -- Beta-amyloid 42/40 Ratio: 0.101 — ABNORMAL LOW (ref >0.102)
Beta-amyloid 40: 137.57 pg/mL
Beta-amyloid 42: 13.85 pg/mL
N -- NfL, Plasma: 2.31 pg/mL (ref 0.00–11.55)
T -- p-tau181: 1.32 pg/mL — ABNORMAL HIGH (ref 0.00–0.97)

## 2024-01-04 NOTE — Telephone Encounter (Signed)
 Pt's daughter states they received lab results on MyChart and would like to go over them. Requesting call back to discuss

## 2024-01-07 MED ORDER — DONEPEZIL HCL 10 MG PO TABS: ORAL_TABLET | ORAL | 1 refills | Status: DC

## 2024-01-07 NOTE — Telephone Encounter (Signed)
 LVM asking daughter to call office.

## 2024-01-07 NOTE — Telephone Encounter (Signed)
 Took call from phone room and spoke with daughter. Relayed Dr. Duncan message. She verbalized understanding.  She is toleratin 5mg  well. E-scribed 10mg  to pharmacy. She wanted just [facility] on file. I updated this.   Placed on hold and spoke with Dr Vear. He does not recommend PET scan. Lab results 85% accurate for Alzheimers. This test points towards her having Alzheimers and this is dx.

## 2024-01-07 NOTE — Addendum Note (Signed)
 Addended by: Arther Abbott on: 01/07/2024 11:54 AM   Modules accepted: Orders

## 2024-01-10 ENCOUNTER — Telehealth: Payer: Self-pay | Admitting: Neurology

## 2024-01-10 NOTE — Telephone Encounter (Signed)
Pt's daughter, April Lawerence asking for nurse to call the patient to explain lab results. Please call my phone first before calling to explain the lab results so can be there with her.

## 2024-01-10 NOTE — Telephone Encounter (Signed)
 Lmtrc 1st attempt

## 2024-01-14 NOTE — Telephone Encounter (Addendum)
Called daughter and she wants [place] to call her mother to discuss labs/plan moving forward instead of her relaying message. Wants a call back once we get in touch with her.   I called pt at [phone]. LVM for her to call office.

## 2024-01-14 NOTE — Telephone Encounter (Signed)
See phone note from 01/04/24. I spoke with daughter 01/07/24 about lab results. I will attempt to call again to see what additional questions she may have

## 2024-01-14 NOTE — Telephone Encounter (Signed)
Called and spoke with pt at  [phone]. Relayed results per Dr. Bonnita Hollow note previously. Pt verbalized understanding. Aware to increase donepezil from 5 to 10mg  po at bedtime. Daughter there at time of call as well. Reminded her about her next f/u 07/07/24 at 1pm.

## 2024-01-21 ENCOUNTER — Telehealth: Payer: Self-pay | Admitting: Neurology

## 2024-01-21 NOTE — Telephone Encounter (Signed)
Spoke w/ Dr. Epimenio Foot. She can try and change to taking every morning instead of at bedtime to see if this helps

## 2024-01-21 NOTE — Telephone Encounter (Signed)
Called daughter back. Relayed recommendation. She will have her try this. If sx continue, she will call back.

## 2024-01-21 NOTE — Telephone Encounter (Signed)
Pt's daughter called wanting to speak to the RN or MD regarding the donepezil (ARICEPT) 10 MG tablet that the pt is on. Daughter states that pt is complaining about having nightmares on the 10mg , but the daughter states that she was already complaining about the nightmares before she started the 10mg . Please advise.

## 2024-02-02 IMAGING — US US BREAST*R* LIMITED INC AXILLA
1 series · 6 of 6 positions shown · non-contrast
Comparison: RIGHT breast ultrasound dated 06/24/2021.

CLINICAL DATA: Follow-up for probably benign mass in the RIGHT
breast. Patient is unable to undergo mammography due to RSD. Remote
history of a fibroadenoma excision.

EXAM:
ULTRASOUND OF THE RIGHT BREAST

[Series 1: us breast*right* limited inc axilla · 0.06mm/px · 6 of 6 slices shown]
[im 1/6]
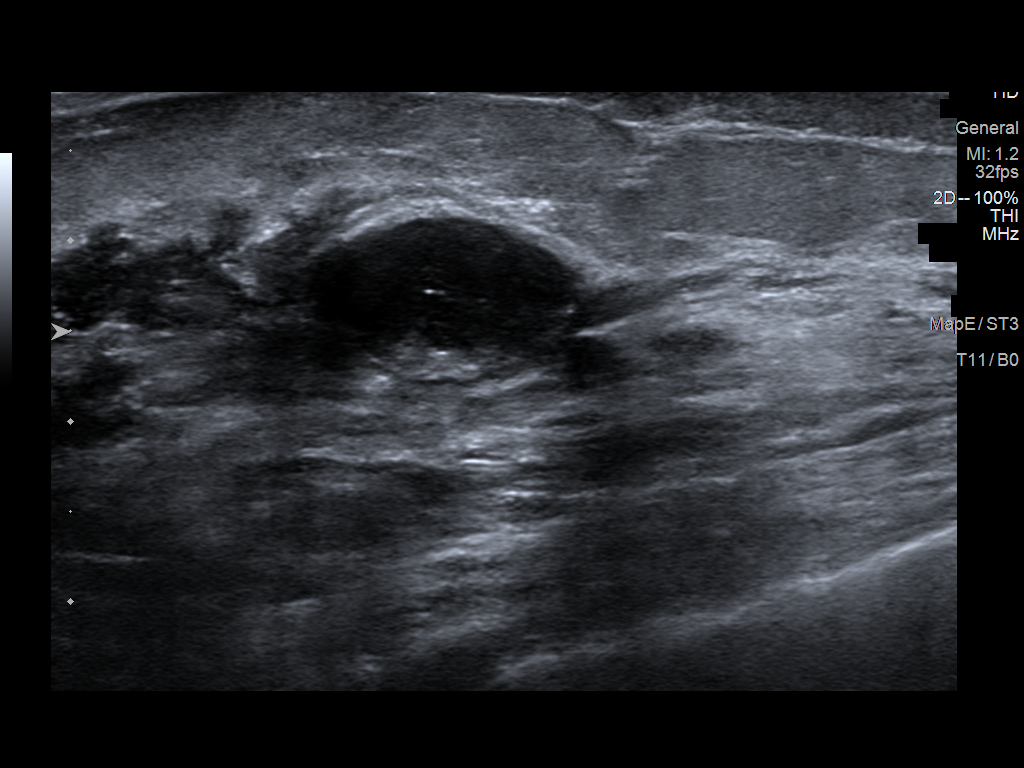
[im 2/6]
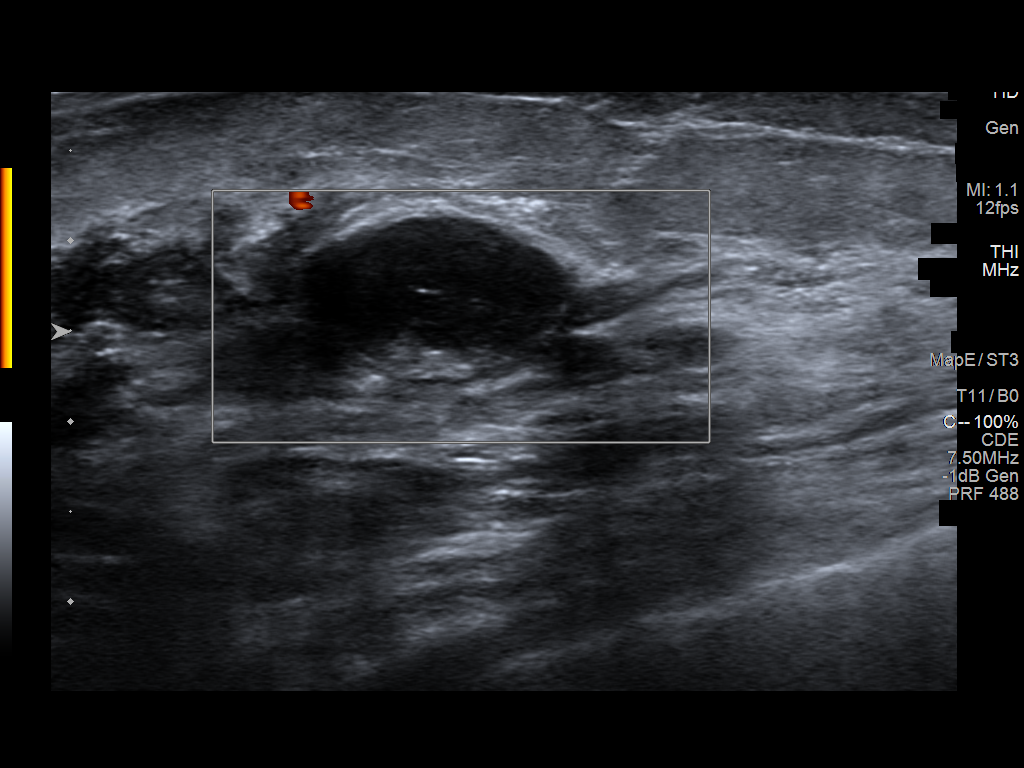
[im 3/6]
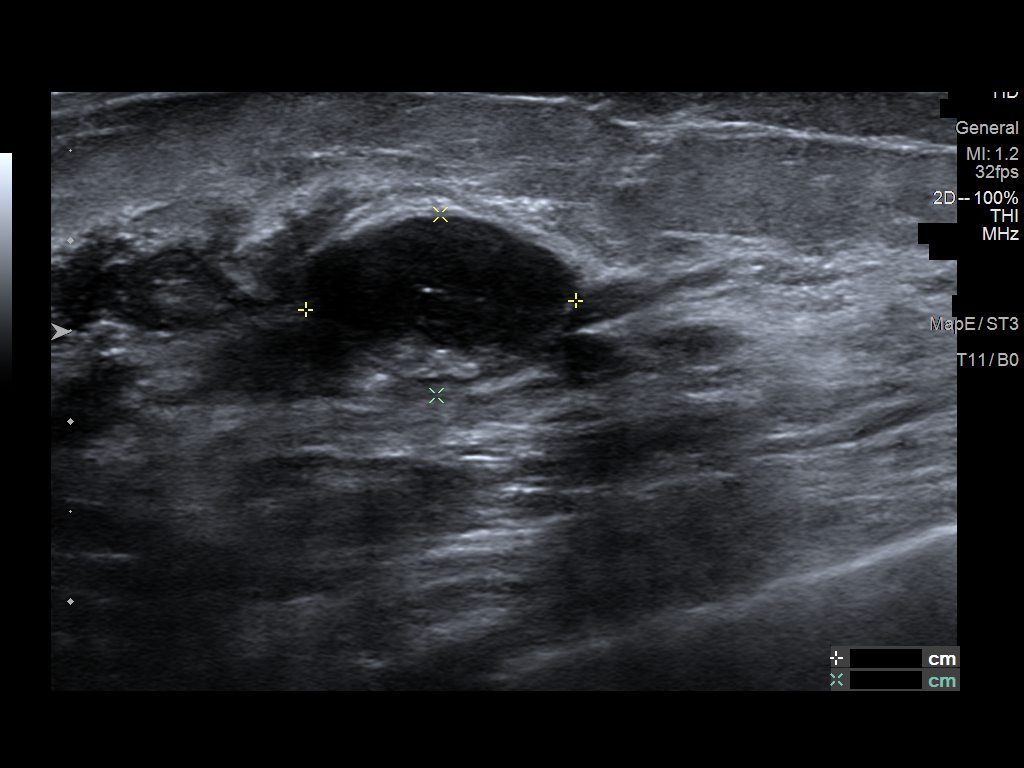
[im 4/6]
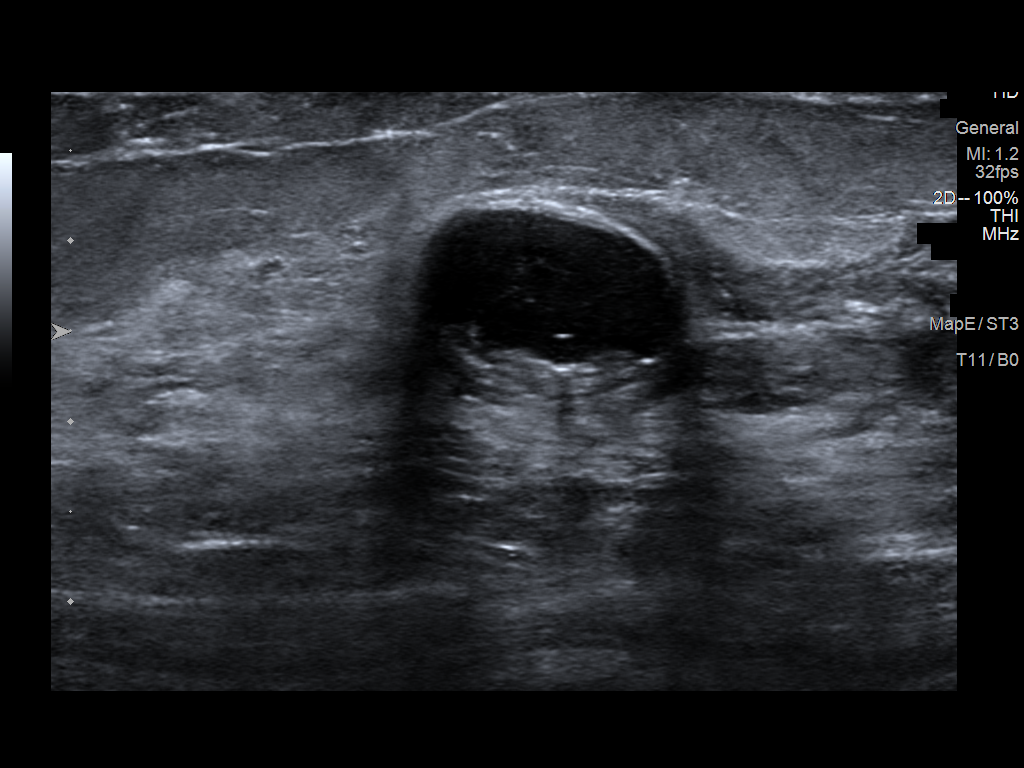
[im 5/6]
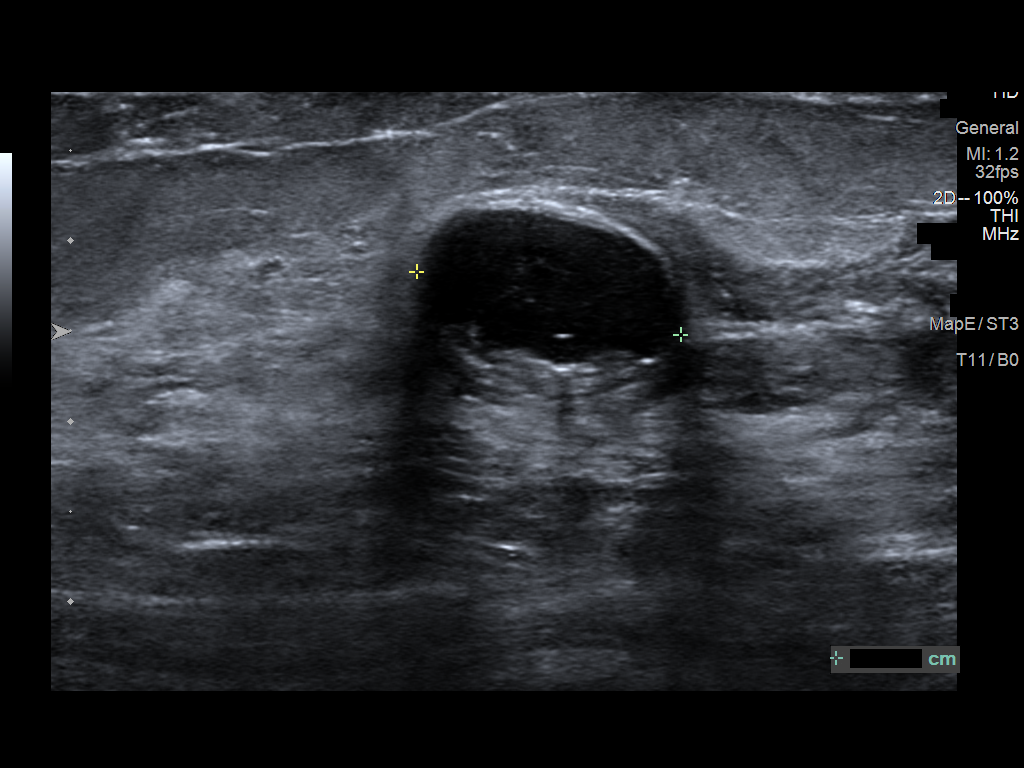
[im 6/6]
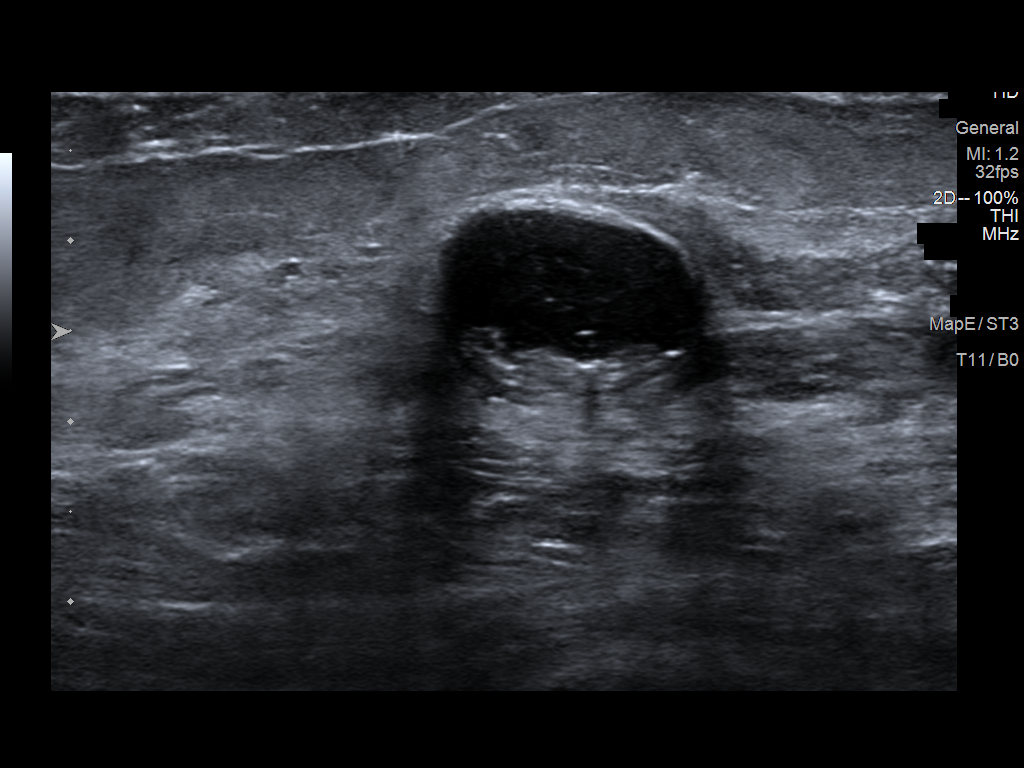

[6 of 6 positions shown; findings below may reference images not displayed]

FINDINGS: Targeted ultrasound is performed, again showing an oval
circumscribed hypoechoic mass in the RIGHT breast at the 12 o'clock
axis, 1 cm from the nipple, measuring 1.5 x 1 x 1.5 cm, stable
compared to the previous ultrasound, most suggestive of a benign
fibroadenoma.
IMPRESSION: Stable probably benign mass in the RIGHT breast at the 12 o'clock
axis, 1 cm from the nipple, measuring 1.5 cm, most likely a benign
fibroadenoma. Recommend follow-up RIGHT breast ultrasound in 6
months to ensure stability.

RECOMMENDATION:
Patient declined the follow-up ultrasound 6 months. However, patient
is willing to return for a follow-up ultrasound in 8 months. Patient
was scheduled for the follow-up accordingly.

I have discussed the findings and recommendations with the patient.
If applicable, a reminder letter will be sent to the patient
regarding the next appointment.

BI-RADS CATEGORY  3: Probably benign.

## 2024-04-02 ENCOUNTER — Other Ambulatory Visit: Payer: Self-pay | Admitting: Neurology

## 2024-04-02 ENCOUNTER — Telehealth: Payer: Self-pay | Admitting: Neurology

## 2024-04-02 MED ORDER — LAMOTRIGINE 25 MG PO TABS: ORAL_TABLET | ORAL | 1 refills | Status: DC

## 2024-04-02 NOTE — Telephone Encounter (Signed)
 Pt is requesting a refill for lamoTRIgine (LAMICTAL) 25 MG tablet .  Pharmacy: [facility]

## 2024-04-02 NOTE — Telephone Encounter (Signed)
 Last seen on 01/01/24 Follow up scheduled on 07/07/24

## 2024-04-03 ENCOUNTER — Other Ambulatory Visit: Payer: Self-pay | Admitting: Neurology

## 2024-04-03 NOTE — Telephone Encounter (Signed)
 Last seen on 01/01/24 Follow up scheduled on 07/07/24

## 2024-04-04 ENCOUNTER — Other Ambulatory Visit: Payer: Self-pay | Admitting: Neurology

## 2024-04-09 ENCOUNTER — Telehealth: Payer: Self-pay | Admitting: Neurology

## 2024-04-09 MED ORDER — RISPERIDONE 0.25 MG PO TABS: 0.2500 mg | ORAL_TABLET | Freq: Two times a day (BID) | ORAL | 0 refills | Status: DC

## 2024-04-09 MED ORDER — DONEPEZIL HCL 5 MG PO TABS: 5.0000 mg | ORAL_TABLET | ORAL | 0 refills | Status: DC

## 2024-04-09 NOTE — Addendum Note (Signed)
 Addended by: Beauford Bounds L on: 04/09/2024 11:11 AM   Modules accepted: Orders

## 2024-04-09 NOTE — Telephone Encounter (Signed)
 Pt's daughter is asking that this risperiDONE (RISPERDAL) 0.25 MG tablet  be called into [facility]

## 2024-04-09 NOTE — Telephone Encounter (Signed)
 Called and spoke with daughter. We recommended she try donepezil in the morning d/t nightmares she reported back on 01/21/2024. She confirmed she tried this for 2 days but stopped d/t feeling she was having SE from medication.    Daughter wants to know if there is another medication Dr. Godwin Lat would recommended instead of donepezil? Also wants to know if he feels she can taper down on risperidone at all? Currently taking 0.25mg  po BID

## 2024-04-09 NOTE — Telephone Encounter (Signed)
 Called daughter. Relayed Dr. Thom Fleeting recommendation. She is agreeable with plan. Pt does not have donepezil left. I called in updated rx to [facility] for 5mg , 1 tablet po each morning #30. She will let us  know if she does not agree to take.

## 2024-04-09 NOTE — Telephone Encounter (Signed)
 Daughter asked that it be noted that since there was a change made to the dose for the donepezil (ARICEPT) 10 MG tablet  pt has decided to no longer take this medication, daughter reports pt has not taken it for a while.

## 2024-04-09 NOTE — Telephone Encounter (Signed)
 Called [facility] at [phone]. Spoke w/ Jaclyn. Cx rx sent in on 04/03/24.   Resent to [facility] as requested.

## 2024-04-10 DIAGNOSIS — R300 Dysuria: Secondary | ICD-10-CM | POA: Diagnosis not present

## 2024-05-08 ENCOUNTER — Other Ambulatory Visit: Payer: Self-pay | Admitting: Neurology

## 2024-05-08 NOTE — Telephone Encounter (Signed)
 Last seen on 01/01/24 Follow up scheduled on 07/07/24

## 2024-07-07 ENCOUNTER — Encounter: Payer: Self-pay | Admitting: Neurology

## 2024-07-07 ENCOUNTER — Other Ambulatory Visit: Payer: Self-pay | Admitting: Neurology

## 2024-07-07 ENCOUNTER — Ambulatory Visit: Payer: Medicare Other | Admitting: Neurology

## 2024-07-07 VITALS — BP 146/76 | HR 80 | Ht 63.0 in | Wt 148.8 lb

## 2024-07-07 DIAGNOSIS — R208 Other disturbances of skin sensation: Secondary | ICD-10-CM | POA: Diagnosis not present

## 2024-07-07 DIAGNOSIS — R413 Other amnesia: Secondary | ICD-10-CM

## 2024-07-07 DIAGNOSIS — G9050 Complex regional pain syndrome I, unspecified: Secondary | ICD-10-CM | POA: Diagnosis not present

## 2024-07-07 DIAGNOSIS — F418 Other specified anxiety disorders: Secondary | ICD-10-CM | POA: Diagnosis not present

## 2024-07-07 DIAGNOSIS — F03A11 Unspecified dementia, mild, with agitation: Secondary | ICD-10-CM | POA: Diagnosis not present

## 2024-07-07 MED ORDER — DONEPEZIL HCL 10 MG PO TABS: 10.0000 mg | ORAL_TABLET | Freq: Every morning | ORAL | 4 refills | Status: AC

## 2024-07-07 MED ORDER — RISPERIDONE 0.25 MG PO TABS: 0.2500 mg | ORAL_TABLET | Freq: Two times a day (BID) | ORAL | 3 refills | Status: AC

## 2024-07-07 MED ORDER — LAMOTRIGINE 25 MG PO TABS: ORAL_TABLET | ORAL | 3 refills | Status: AC

## 2024-07-07 MED ORDER — DONEPEZIL HCL 10 MG PO TABS: 5.0000 mg | ORAL_TABLET | Freq: Every morning | ORAL | 4 refills | Status: DC

## 2024-07-07 NOTE — Telephone Encounter (Signed)
 Pt has appointment scheduled today at 1pm

## 2024-07-07 NOTE — Progress Notes (Signed)
 [facility]  PATIENT: Tami Lewis DOB: Sep 06, 1941  REFERRING DOCTOR OR PCP: Denise Manly, MD SOURCE: Patient, daughter, notes from primary care, imaging and lab reports, imaging studies personally reviewed.  _________________________________   HISTORICAL  CHIEF COMPLAINT:  Chief Complaint  Patient presents with   Follow-up    Pt in room 10. Daughter in room.Here for memory follow up. Patient reports memory is okay.MMSE:28    HISTORY OF PRESENT ILLNESS:  Tami Lewis is a 83 year old woman with painful dysesthesias/RSD, and memory loss.     Update 07/07/2024 She continues to note a lot of issues with her memory --- this is fluctuating and she was actually in normal range for the MMSE in 2023.    She is better at 28/30 today on the MMSE (was 22 last checked in 12/2023)  .    Sh She has anxiety and depression.   She is on Xanax  for anxiety.    She has a lot of rage/mood swings.   She continues to have some difficulty with cognition and scored only 22/30 on Mini-Mental status exam last year.   She had neurocognitive testing in 2018 (Dr. Shelle).  At the time, she did not meet criteria for a dementia syndrome though she was found to have a lot of anxiety due to concerns of her somatic complaints and pain.    Besides memory concerns, she also appears to have a personality disorder and the traits have become more pronounced as she has had more difficulties with her memory.  This has become difficult for her husband.  She repots left> right leg numbness and  burning dysesthesia.     This is worse in the afternoon.   She has pain in her pelvic region as well.    She had been diagnosed with RSD in the past though symptoms are nt typical and I question that diagnosis.     She is on lamotrigine  currently 25-25-50 and it seems to help.    She feels higher dose of lamotrigine  made constipation worse.   Gabapentin had been tried in the past but due to constipation she  stopped.   She has some urinary hesitancy at times.    She takes alpha-lipoic acid   She reports reduced balance and gait.  She uses a walking stick much of the time.   She had one fall last year.      She goes to bed around 11 pm and wakes up around 9-10 but often does not get out of bed until 11 am.   She uses a CBD/THC once or twice a month if unable to sleep.            07/07/2024   12:53 PM 01/01/2024    2:28 PM 03/01/2023    2:03 PM  MMSE - Mini Mental State Exam  Orientation to time 4 4 3   Orientation to Place 5 4 5   Registration 3 3 3   Attention/ Calculation 5 2 1   Recall 3 2 2   Language- name 2 objects 2 2 2   Language- repeat 1 1 1   Language- follow 3 step command 3 1 3   Language- read & follow direction 1 1 1   Write a sentence 1 1 1   Copy design 0 1 1  Total score 28 22 23       ADDITIONAL HISTORY: I saw her in 2019 for memory loss and or RSD related pain in the right ankle.  She also was having some agitation  and anxiety.  I prescribed lamotrigine  to titrate up to 100 mg p.o. twice daily.  She never started the medication..  More recently, in May, she was prescribed gabapentin 100 mg p.o. 3 times daily but feels it causes constipation (difficult for her as she has a prolapsed uterus).  In 2019, she had neuro cognitive testing.  She saw Dr. Shelle who diagnosed severe anxiety but no primary dementia.   Detailed testing, results and recommendations were reviewed.  There is a family history of Alzheimer's disease.  She has some insomnia.    In 2020, she was placed on Paxil and her mood they have done little bit better but she stopped because she felt her tinnitus was worse.  Her daughter reports that she had more issues with her memory starting in 2020.  She became more forgetful.  She would blame others for her mistakes.  She would have episodes of irritability and anger.  She had an evaluation at the [facility] but walked out angry during the exam.   Symptoms progressed a little bit further in 2021.  In March 2022, she was placed on Zoloft but stopped after 2 weeks.  IMAGING: CT scan of the head dated 05/03/2021.  This showed no acute findings.  There was mild atrophy, typical for age.  There was chronic hypodense changes in the right anterior limb of the internal capsule, not present in 2007.   MRI of the brain 03/28/2006 showed minimal chronic microvascular ischemic change.    REVIEW OF SYSTEMS: Constitutional: No fevers, chills, sweats, or change in appetite Eyes: No visual changes, double vision, eye pain Ear, nose and throat: No hearing loss, ear pain, nasal congestion, sore throat Cardiovascular: No chest pain, palpitations Respiratory:  No shortness of breath at rest or with exertion.   No wheezes GastrointestinaI: No nausea, vomiting, diarrhea, abdominal pain, fecal incontinence Genitourinary:  No dysuria, urinary retention or frequency.  No nocturia. Musculoskeletal:  No neck pain, back pain Integumentary: No rash, pruritus, skin lesions Neurological: as above Psychiatric: As above endocrine: No palpitations, diaphoresis, change in appetite, change in weigh or increased thirst Hematologic/Lymphatic:  No anemia, purpura, petechiae. Allergic/Immunologic: No itchy/runny eyes, nasal congestion, recent allergic reactions, rashes  ALLERGIES: Allergies  Allergen Reactions   Tetracyclines & Related Other (See Comments)    Possibly caused c-diff     HOME MEDICATIONS:  Current Outpatient Medications:    ALPRAZolam  (XANAX ) 1 MG tablet, Take 0.5-2 mg by mouth See admin instructions. Take 1/2 tablet (0.5 mg) by mouth daily at 4pm, take 2 tablets (2 mg) at bedtime, Disp: , Rfl:    Ascorbic Acid (VITAMIN C) 1000 MG tablet, Take 1,000 mg by mouth daily., Disp: , Rfl:    B Complex Vitamins (B COMPLEX 50) TABS, Take 1 tablet by mouth daily., Disp: , Rfl:    Cholecalciferol (VITAMIN D3) 50 MCG (2000 UT) TABS, Take 2,000 Units by mouth  daily., Disp: , Rfl:    Homeopathic Products (ARNICARE) GEL, Apply 1 application topically daily as needed (pain)., Disp: , Rfl:    Ibuprofen-Acetaminophen 125-250 MG TABS, Take 1-2 tablets by mouth daily as needed (pain/headache)., Disp: , Rfl:    LIDOCAINE  EX, Apply 1 application topically daily as needed (pain)., Disp: , Rfl:    Lutein 20 MG TABS, Take 20 mg by mouth daily., Disp: , Rfl:    MAGNESIUM CITRATE PO, Take 368 mg by mouth daily. 184 mg X 2 tablets, Disp: , Rfl:    Omega 3  1000 MG CAPS, Take 1,000 mg by mouth daily., Disp: , Rfl:    OVER THE COUNTER MEDICATION, Take 1-2 tablets by mouth at bedtime as needed (sleep). Calms forte, Disp: , Rfl:    OVER THE COUNTER MEDICATION, Take 1-2 tablets by mouth at bedtime as needed (sleep). Best Sleep, Disp: , Rfl:    polyethylene glycol (MIRALAX  / GLYCOLAX ) 17 g packet, Take 17 g by mouth daily as needed (constipation)., Disp: , Rfl:    Probiotic Product (PROBIOTIC PO), Take 1 tablet by mouth daily., Disp: , Rfl:    Quercetin 500 MG CAPS, Take 500 mg by mouth 2 (two) times daily as needed (allergies)., Disp: , Rfl:    Zinc 20 MG CAPS, Take 20 mg by mouth daily., Disp: , Rfl:    donepezil  (ARICEPT ) 10 MG tablet, Take 1 tablet (10 mg total) by mouth every morning., Disp: 90 tablet, Rfl: 4   lamoTRIgine  (LAMICTAL ) 25 MG tablet, TAKE 1 TABLETS IN THE MORNING, 1 TABS AT 3PM, AND 2 TABLETS AT BEDTIME, Disp: 360 tablet, Rfl: 3   risperiDONE  (RISPERDAL ) 0.25 MG tablet, Take 1 tablet (0.25 mg total) by mouth 2 (two) times daily., Disp: 180 tablet, Rfl: 3   traMADol  (ULTRAM ) 50 MG tablet, Take 1/2 to 1 pill po bid prn (Patient not taking: Reported on 07/07/2024), Disp: 30 tablet, Rfl: 1  PAST MEDICAL HISTORY: Past Medical History:  Diagnosis Date   Hyperthyroidism    Reflex sympathetic dystrophy    Vision abnormalities     PAST SURGICAL HISTORY: Past Surgical History:  Procedure Laterality Date   TONSILLECTOMY      FAMILY HISTORY: Family  History  Problem Relation Age of Onset   Stroke Father    Dementia Paternal Aunt     SOCIAL HISTORY:  Social History   Socioeconomic History   Marital status: Married    Spouse name: Not on file   Number of children: Not on file   Years of education: Not on file   Highest education level: Not on file  Occupational History   Not on file  Tobacco Use   Smoking status: Never   Smokeless tobacco: Never  Vaping Use   Vaping status: Never Used  Substance and Sexual Activity   Alcohol use: No    Comment: a sip a wine occ   Drug use: No   Sexual activity: Not on file  Other Topics Concern   Not on file  Social History Narrative   Lives with husband   Right handed   Caffeine: 1 cup in the AM   Social Drivers of Health   Financial Resource Strain: Not on file  Food Insecurity: Not on file  Transportation Needs: Not on file  Physical Activity: Not on file  Stress: Not on file  Social Connections: Unknown (05/09/2022)   Received from [facility]   Social Network    Social Network: Not on file  Intimate Partner Violence: Unknown (03/30/2022)   Received from [facility]   HITS    Physically Hurt: Not on file    Insult or Talk Down To: Not on file    Threaten Physical Harm: Not on file    Scream or Curse: Not on file     PHYSICAL EXAM  Vitals:   07/07/24 1248  BP: (!) 146/76  Pulse: 80  Weight: 148 lb 12.8 oz (67.5 kg)  Height: 5' 3 (1.6 m)    Body mass index is 26.36 kg/m.   General: The patient  is well-developed and well-nourished and in no acute distress  HEENT:  Head is [place]/AT.  Sclera are anicteric.  Skin: Extremities are without rash or  edema. Ankle regions are symmetric.  Neurologic Exam  Mental status: The patient is alert and oriented x 2 1/2 at the time of the examination.  She had reduced short-term memory.  Score was 28/30 on the MMSE.SABRA  Speech was normal.    Cranial nerves: Extraocular movements are full.   Facial symmetry is present.   .Facial strength is normal.  No obvious hearing deficits are noted.  Motor:  Muscle bulk is normal.   Tone is normal. Strength is  5 / 5 in all 4 extremities.   Sensory: Sensory testing is intact to touch.  Coordination: Cerebellar testing reveals good finger-nose-finger and heel-to-shin bilaterally.  Gait and station: Station is normal.   Gait is near normal. Her tandem gait is mildly wide but fairly normal for age.  She can turn in 3 steps.   Romberg is negative.   Reflexes: Deep tendon reflexes are symmetric and normal bilaterally.        ASSESSMENT AND PLAN  Mild dementia with agitation, unspecified dementia type (HCC)  Memory loss  Dysesthesia  Depression with anxiety  RSD (reflex sympathetic dystrophy)   For dysesthesias, continue lamotrigine  25 mg po q noon, 25 mg po q evening and 50 mg qHS  Ok to take alpha-lipoic acid 600 mg bid MMSE was 28/30 (normal).    Increase Donepezil  to 10 mg was already being taken. She is on a very low-dose of Risperdal  in the late afternoon and at night.   Her daughter notes that this has been very helpful with less agitation she is on alprazolam  (PCP writes) We discussed trying to be more physically active and try to get out of the house.  She is not interested in doing any senior program.. Rtc 6 months or sooner if new or worsening issues    This visit is part of a comprehensive longitudinal care medical relationship regarding the patients primary diagnosis of cognitive impairment and related concerns.     Joclyn Alsobrook A. Vear, MD, [facility] 07/07/2024, 1:34 PM Certified in Neurology, Clinical Neurophysiology, Sleep Medicine and Neuroimaging  [facility] [address], [address] [place], [place] [address] [phone]

## 2024-07-29 DIAGNOSIS — H25043 Posterior subcapsular polar age-related cataract, bilateral: Secondary | ICD-10-CM | POA: Diagnosis not present

## 2024-07-29 DIAGNOSIS — H5211 Myopia, right eye: Secondary | ICD-10-CM | POA: Diagnosis not present

## 2024-07-29 DIAGNOSIS — H524 Presbyopia: Secondary | ICD-10-CM | POA: Diagnosis not present

## 2024-07-29 DIAGNOSIS — H2513 Age-related nuclear cataract, bilateral: Secondary | ICD-10-CM | POA: Diagnosis not present

## 2024-07-29 DIAGNOSIS — H25013 Cortical age-related cataract, bilateral: Secondary | ICD-10-CM | POA: Diagnosis not present

## 2024-07-29 DIAGNOSIS — H5202 Hypermetropia, left eye: Secondary | ICD-10-CM | POA: Diagnosis not present

## 2024-07-29 DIAGNOSIS — H52223 Regular astigmatism, bilateral: Secondary | ICD-10-CM | POA: Diagnosis not present

## 2024-07-29 DIAGNOSIS — H16223 Keratoconjunctivitis sicca, not specified as Sjogren's, bilateral: Secondary | ICD-10-CM | POA: Diagnosis not present

## 2024-10-15 DIAGNOSIS — Z79899 Other long term (current) drug therapy: Secondary | ICD-10-CM | POA: Diagnosis not present

## 2024-10-15 DIAGNOSIS — M810 Age-related osteoporosis without current pathological fracture: Secondary | ICD-10-CM | POA: Diagnosis not present

## 2024-10-15 DIAGNOSIS — E7800 Pure hypercholesterolemia, unspecified: Secondary | ICD-10-CM | POA: Diagnosis not present

## 2024-10-15 DIAGNOSIS — G9050 Complex regional pain syndrome I, unspecified: Secondary | ICD-10-CM | POA: Diagnosis not present

## 2024-10-15 DIAGNOSIS — K5909 Other constipation: Secondary | ICD-10-CM | POA: Diagnosis not present

## 2024-10-15 DIAGNOSIS — M797 Fibromyalgia: Secondary | ICD-10-CM | POA: Diagnosis not present

## 2024-10-15 DIAGNOSIS — R413 Other amnesia: Secondary | ICD-10-CM | POA: Diagnosis not present

## 2024-10-15 DIAGNOSIS — D75839 Thrombocytosis, unspecified: Secondary | ICD-10-CM | POA: Diagnosis not present

## 2024-10-15 DIAGNOSIS — R2689 Other abnormalities of gait and mobility: Secondary | ICD-10-CM | POA: Diagnosis not present

## 2024-10-15 DIAGNOSIS — Z0000 Encounter for general adult medical examination without abnormal findings: Secondary | ICD-10-CM | POA: Diagnosis not present

## 2024-10-15 DIAGNOSIS — E039 Hypothyroidism, unspecified: Secondary | ICD-10-CM | POA: Diagnosis not present

## 2025-03-10 ENCOUNTER — Ambulatory Visit: Admitting: Neurology
